# Patient Record
Sex: Male | Born: 1970 | Race: Black or African American | Hispanic: No | Marital: Married | Smoking: Never smoker
Health system: Southern US, Community
[De-identification: ages and names within clinical notes are randomized; demographics above are authoritative.]

## PROBLEM LIST (undated history)

## (undated) HISTORY — PX: HERNIA REPAIR: SHX51

---

## 1999-10-22 HISTORY — PX: LEG SURGERY: SHX1003

## 1999-10-22 HISTORY — PX: BACK SURGERY: SHX140

## 2000-05-08 ENCOUNTER — Inpatient Hospital Stay (HOSPITAL_COMMUNITY): Admission: EM | Admit: 2000-05-08 | Discharge: 2000-05-14 | Payer: Self-pay

## 2000-05-08 ENCOUNTER — Encounter: Payer: Self-pay | Admitting: General Surgery

## 2000-05-08 ENCOUNTER — Encounter: Payer: Self-pay | Admitting: Emergency Medicine

## 2000-06-18 ENCOUNTER — Encounter: Payer: Self-pay | Admitting: Neurosurgery

## 2000-06-18 ENCOUNTER — Ambulatory Visit (HOSPITAL_COMMUNITY): Admission: RE | Admit: 2000-06-18 | Discharge: 2000-06-18 | Payer: Self-pay | Admitting: Neurosurgery

## 2000-07-23 ENCOUNTER — Encounter: Admission: RE | Admit: 2000-07-23 | Discharge: 2000-08-07 | Payer: Self-pay | Admitting: General Surgery

## 2000-09-22 ENCOUNTER — Encounter: Payer: Self-pay | Admitting: Neurosurgery

## 2000-09-22 ENCOUNTER — Ambulatory Visit (HOSPITAL_COMMUNITY): Admission: RE | Admit: 2000-09-22 | Discharge: 2000-09-22 | Payer: Self-pay | Admitting: Neurosurgery

## 2000-10-19 ENCOUNTER — Emergency Department (HOSPITAL_COMMUNITY): Admission: EM | Admit: 2000-10-19 | Discharge: 2000-10-19 | Payer: Self-pay | Admitting: Emergency Medicine

## 2000-11-19 ENCOUNTER — Encounter: Admission: RE | Admit: 2000-11-19 | Discharge: 2001-02-17 | Payer: Self-pay | Admitting: Neurosurgery

## 2011-04-23 ENCOUNTER — Encounter (INDEPENDENT_AMBULATORY_CARE_PROVIDER_SITE_OTHER): Payer: Self-pay | Admitting: Surgery

## 2011-04-30 ENCOUNTER — Encounter (INDEPENDENT_AMBULATORY_CARE_PROVIDER_SITE_OTHER): Payer: 59 | Admitting: Surgery

## 2011-04-30 ENCOUNTER — Ambulatory Visit (INDEPENDENT_AMBULATORY_CARE_PROVIDER_SITE_OTHER): Payer: 59 | Admitting: Surgery

## 2011-04-30 DIAGNOSIS — Z9889 Other specified postprocedural states: Secondary | ICD-10-CM

## 2011-04-30 NOTE — Progress Notes (Signed)
Subjective:     Patient ID: Barry Conway, male   DOB: 29-Mar-1971, 40 y.o.   MRN: [id number]    There were no vitals taken for this visit.    HPI He is here for a postoperative visit status post right inguinal hernia repair with mesh. He is 3 weeks out from surgery. He is doing very well and has no complaints. Review of Systems     Objective:   Physical Exam On exam, his incision is well-healed. There is no evidence of recurrent hernia.    Assessment:     Patient status post right inguinal hernia repair with mesh.    Plan:     I gave him a note to return to work today. He will refrain from heavy lifting for one more week. I will see him back as needed.

## 2013-12-14 ENCOUNTER — Ambulatory Visit: Payer: Self-pay | Admitting: General Practice

## 2013-12-15 ENCOUNTER — Ambulatory Visit (INDEPENDENT_AMBULATORY_CARE_PROVIDER_SITE_OTHER): Payer: 59 | Admitting: General Practice

## 2013-12-15 ENCOUNTER — Encounter (INDEPENDENT_AMBULATORY_CARE_PROVIDER_SITE_OTHER): Payer: Self-pay

## 2013-12-15 ENCOUNTER — Encounter: Payer: Self-pay | Admitting: General Practice

## 2013-12-15 VITALS — BP 142/84 | HR 70 | Temp 97.9°F | Ht 70.0 in | Wt 211.0 lb

## 2013-12-15 DIAGNOSIS — Z89512 Acquired absence of left leg below knee: Secondary | ICD-10-CM | POA: Insufficient documentation

## 2013-12-15 DIAGNOSIS — S88119A Complete traumatic amputation at level between knee and ankle, unspecified lower leg, initial encounter: Secondary | ICD-10-CM

## 2013-12-15 DIAGNOSIS — Z449 Encounter for fitting and adjustment of unspecified external prosthetic device: Secondary | ICD-10-CM
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
## 2013-12-21 ENCOUNTER — Telehealth: Payer: Self-pay | Admitting: General Practice

## 2013-12-22 NOTE — Telephone Encounter (Signed)
Aware, rx sent in. 

## 2013-12-22 NOTE — Telephone Encounter (Signed)
Please notify patient that information refaxed and confirmed receipt.

## 2014-01-31 ENCOUNTER — Telehealth: Payer: Self-pay | Admitting: General Practice

## 2014-01-31 NOTE — Telephone Encounter (Signed)
Patient aware that we have no more available appts for today

## 2015-09-28 ENCOUNTER — Ambulatory Visit (INDEPENDENT_AMBULATORY_CARE_PROVIDER_SITE_OTHER): Payer: 59 | Admitting: Family Medicine

## 2015-09-28 ENCOUNTER — Encounter: Payer: Self-pay | Admitting: Family Medicine

## 2015-09-28 VITALS — BP 166/102 | HR 59 | Temp 98.2°F | Ht 70.0 in | Wt 203.6 lb

## 2015-09-28 DIAGNOSIS — S39012A Strain of muscle, fascia and tendon of lower back, initial encounter: Secondary | ICD-10-CM
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
## 2015-10-09 ENCOUNTER — Ambulatory Visit: Payer: Managed Care, Other (non HMO) | Admitting: Physical Therapy

## 2015-11-02 NOTE — Therapy (Signed)
[facility] [address] [place], [place], [address] Phone: [phone]   Fax:  [phone]  Patient Details  Name: Barry Conway MRN: [id number] Date of Birth: 04/12/1971 Referring Provider:  Dettinger, Elige RadonJoshua A, MD  Encounter Date: 10/09/2015   Patient arrived with no back pain.  Told patient should his pain return he can call the clinic to re-schedule.  Bryann Gentz, [place] MPT 11/02/2015, 4:26 PM  [facility] [address] [place], [place], [address] Phone: [phone]   Fax:  [phone]

## 2020-04-26 ENCOUNTER — Ambulatory Visit (INDEPENDENT_AMBULATORY_CARE_PROVIDER_SITE_OTHER): Payer: Managed Care, Other (non HMO) | Admitting: Family Medicine

## 2020-04-26 ENCOUNTER — Other Ambulatory Visit: Payer: Self-pay

## 2020-04-26 ENCOUNTER — Encounter: Payer: Self-pay | Admitting: Family Medicine

## 2020-04-26 ENCOUNTER — Ambulatory Visit (INDEPENDENT_AMBULATORY_CARE_PROVIDER_SITE_OTHER): Payer: Managed Care, Other (non HMO)

## 2020-04-26 DIAGNOSIS — M25561 Pain in right knee: Secondary | ICD-10-CM | POA: Diagnosis not present

## 2020-04-26 DIAGNOSIS — M2351 Chronic instability of knee, right knee: Secondary | ICD-10-CM
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
## 2020-05-21 ENCOUNTER — Other Ambulatory Visit: Payer: Self-pay

## 2020-05-21 ENCOUNTER — Ambulatory Visit
Admission: RE | Admit: 2020-05-21 | Discharge: 2020-05-21 | Disposition: A | Discharge status: Home / Self Care | Payer: Managed Care, Other (non HMO) | Source: Ambulatory Visit | Attending: Family Medicine | Admitting: Family Medicine

## 2020-05-21 DIAGNOSIS — M2351 Chronic instability of knee, right knee: Secondary | ICD-10-CM

## 2020-05-21 DIAGNOSIS — M25561 Pain in right knee: Secondary | ICD-10-CM

## 2020-05-22 ENCOUNTER — Telehealth: Payer: Self-pay | Admitting: Family Medicine

## 2020-05-22 NOTE — Telephone Encounter (Signed)
MRI shows some arthritis changes behind the kneecap.  No cartilage tears, no ligament tears.  There's not a clear-cut indication for surgery.  For treatment, could consider:  - physical therapy - cortisone injection

## 2020-05-24 NOTE — Telephone Encounter (Signed)
I called and left voice mail to call me back. 

## 2020-05-25 NOTE — Telephone Encounter (Signed)
I called and advised the patient. He is choosing to try the cortisone injection. Scheduled an appointment for 8/11 at 3:40 with Dr. Prince Rome.

## 2020-05-31 ENCOUNTER — Encounter: Payer: Self-pay | Admitting: Family Medicine

## 2020-05-31 ENCOUNTER — Ambulatory Visit (INDEPENDENT_AMBULATORY_CARE_PROVIDER_SITE_OTHER): Payer: Managed Care, Other (non HMO) | Admitting: Family Medicine

## 2020-05-31 ENCOUNTER — Other Ambulatory Visit: Payer: Self-pay

## 2020-05-31 DIAGNOSIS — M25561 Pain in right knee: Secondary | ICD-10-CM

## 2020-05-31 NOTE — Progress Notes (Signed)
   Office Visit Note   Patient: Barry Conway           Date of Birth: 1970-11-24           MRN: [id number] Visit Date: 05/31/2020 Requested by: Dettinger, Elige Radon, MD [address] [place],  [place] [address] PCP: Dettinger, Elige Radon, MD  Subjective: Chief Complaint  Patient presents with  . Right Knee - Pain    HPI:  49yo M with known history of patellar compartment OA, who is presenting today requesting a cortisone injection of his knee. Patient states his pain is very troublesome with prolonged walking, running, or going up and down stairs. He states that his pain has progressed to the point where he feels he is willing to try the steroid as previously discussed.  He is very nervous today about the injection, but is optimistic this may help. He has never had a joint injection before.               ROS:   All other systems were reviewed and are negative.  Objective: Vital Signs: There were no vitals taken for this visit.  Physical Exam:  General:  Alert and oriented, in no acute distress. Pulm:  Breathing unlabored. Psy:  Normal mood, congruent affect. Skin:  No rashes, bruising. Right knee skin intact.   Right knee:  No obvious effusion. 2+ patellar crepitus with knee flexion/extension. Pain with patellar compression. Pain with palpation of lateral joint line.   Imaging: No results found.  Assessment & Plan: 49yo M presenting to clinic with chronic knee pain, known patellar OA, requesting steroid injection. Risks and benefits of steroid injection discussed, and verbal consent obtained. Procedure performed as described below, which patient tolerated very well. No immediate complications.  - Strict return precautions were discussed - If no improvement, RTC for reevaluation and consideration of Viscosupplementation or other treatment modality.  -Patient has no further questions or concerns today.      Procedures: RIGHT KNEE INJECTION:  Risks and benefits discussed, patient  opted to proceed.  Time out performed.  Overlying skin prepped in a sterile fashion using iodine and alcohol.  8cc 1% licocaine was injected into the right knee while leg was extended, using a superiolateral approach using a 25G, 1.5in needle. Needle remained in place and 1cc methylprednisolone (40mg ) was then injected into the joint. Patient tolerated very well.  Aftercare discussed.      PMFS History: Patient Active Problem List   Diagnosis Date Noted  . History of left below knee amputation (HCC) 12/15/2013   History reviewed. No pertinent past medical history.  History reviewed. No pertinent family history.  Past Surgical History:  Procedure Laterality Date  . BACK SURGERY  2001  . HERNIA REPAIR    . LEG SURGERY  2001   Social History   Occupational History  . Not on file  Tobacco Use  . Smoking status: Never Smoker  . Smokeless tobacco: Never Used  Substance and Sexual Activity  . Alcohol use: No  . Drug use: Not on file  . Sexual activity: Not on file

## 2020-05-31 NOTE — Progress Notes (Signed)
I saw and examined the patient with Dr. Marga Hoots and agree with assessment and plan as outlined.    Here with persistent right knee pain.  MRI showed mild arthritis changes in patellofemoral joint.  Will inject with cortisone.  If pain persists, then PT.

## 2020-06-29 ENCOUNTER — Encounter: Payer: Self-pay | Admitting: Family Medicine

## 2020-06-29 ENCOUNTER — Ambulatory Visit (INDEPENDENT_AMBULATORY_CARE_PROVIDER_SITE_OTHER): Payer: Managed Care, Other (non HMO) | Admitting: Family Medicine

## 2020-06-29 ENCOUNTER — Other Ambulatory Visit: Payer: Self-pay

## 2020-06-29 DIAGNOSIS — M1711 Unilateral primary osteoarthritis, right knee: Secondary | ICD-10-CM

## 2020-06-29 MED ORDER — DICLOFENAC SODIUM 1 % EX GEL: 4.0000 g | Freq: Four times a day (QID) | CUTANEOUS | 6 refills | Status: DC | PRN

## 2020-06-29 NOTE — Progress Notes (Signed)
   Office Visit Note   Patient: Barry Conway           Date of Birth: 1971/08/05           MRN: [id number] Visit Date: 06/29/2020 Requested by: Dettinger, Elige Radon, MD [address] [place],  [place] [address] PCP: Dettinger, Elige Radon, MD  Subjective: Chief Complaint  Patient presents with  . Right Knee - Pain, Follow-up    The cortisone injection helped for 2 weeks and then the knee is back to as before.    HPI: He is here for follow-up right knee patellofemoral DJD.  Unfortunately the cortisone injection helped only 2 weeks.  The pain is most intense when loading weight on the knee when it is bent, squatting down, sitting into a chair, going up steps.  It bothers him at night when he has his knee in a certain position, or when he tries to move it, but most the time it does not wake him from sleep.              ROS:   All other systems were reviewed and are negative.  Objective: Vital Signs: There were no vitals taken for this visit.  Physical Exam:  General:  Alert and oriented, in no acute distress. Pulm:  Breathing unlabored. Psy:  Normal mood, congruent affect.  Right knee: Trace effusion, no warmth or erythema.  2+ patellofemoral crepitus, no significant pain with patella compression today.  Imaging: No results found.  Assessment & Plan: 1.  Right knee patellofemoral DJD -Discussed options and elected to try glucosamine and turmeric over-the-counter, Voltaren gel topically.  We will also request approval for gel injections for the right knee.     Procedures: No procedures performed  No notes on file     PMFS History: Patient Active Problem List   Diagnosis Date Noted  . History of left below knee amputation (HCC) 12/15/2013   History reviewed. No pertinent past medical history.  History reviewed. No pertinent family history.  Past Surgical History:  Procedure Laterality Date  . BACK SURGERY  2001  . HERNIA REPAIR    . LEG SURGERY  2001   Social History    Occupational History  . Not on file  Tobacco Use  . Smoking status: Never Smoker  . Smokeless tobacco: Never Used  Substance and Sexual Activity  . Alcohol use: No  . Drug use: Not on file  . Sexual activity: Not on file

## 2020-06-29 NOTE — Patient Instructions (Signed)
     Glucosamine Sulfate:  1,000 mg twice daily  Turmeric:  500 mg twice daily  Voltaren gel:  Can apply up to 4 times daily as needed

## 2020-06-30 ENCOUNTER — Telehealth: Payer: Self-pay | Admitting: Family Medicine

## 2020-06-30 NOTE — Telephone Encounter (Signed)
I called the patient and advised him the medication needed prior approval from his insurance company. It has been approved, so he can obtain this from [facility] today (I left the authorization on [facility] VM, as well).

## 2020-06-30 NOTE — Telephone Encounter (Signed)
Patient called requesting Terri to call pharmacy. Patient states they will not release diclofenac sodium gel prescribed from Dr. Prince Rome until cleared to release by doctor or nurse. [facility] on file and call patient. Patient phone number is [phone].

## 2020-07-04 NOTE — Progress Notes (Signed)
Noted  

## 2020-07-12 ENCOUNTER — Telehealth: Payer: Self-pay

## 2020-07-12 NOTE — Telephone Encounter (Signed)
Submitted VOB, Monovisc, right knee. 

## 2020-07-18 ENCOUNTER — Telehealth: Payer: Self-pay

## 2020-07-18 NOTE — Telephone Encounter (Signed)
Approved, Monovisc, right knee. [place] Once deductible has been met, patient will be responsible for co-insurance. No Co-pay No PA required  Appt. 07/20/2020 with Dr. Junius Roads

## 2020-07-20 ENCOUNTER — Encounter: Payer: Self-pay | Admitting: Family Medicine

## 2020-07-20 ENCOUNTER — Ambulatory Visit (INDEPENDENT_AMBULATORY_CARE_PROVIDER_SITE_OTHER): Payer: Managed Care, Other (non HMO) | Admitting: Family Medicine

## 2020-07-20 ENCOUNTER — Other Ambulatory Visit: Payer: Self-pay

## 2020-07-20 DIAGNOSIS — M1711 Unilateral primary osteoarthritis, right knee: Secondary | ICD-10-CM

## 2020-07-20 NOTE — Progress Notes (Signed)
Subjective: He is here for planned Monovisc injection for right knee osteoarthritis.  Objective: Trace effusion with no warmth or erythema.  Procedure: Right knee Monovisc injection: After sterile prep with Betadine, injected 3 cc 1% lidocaine without epinephrine and Monovisc from lateral midpatellar approach, a flash of clear yellow synovial fluid was obtained prior to injection.

## 2020-08-21 ENCOUNTER — Ambulatory Visit (INDEPENDENT_AMBULATORY_CARE_PROVIDER_SITE_OTHER): Payer: Managed Care, Other (non HMO) | Admitting: Family Medicine

## 2020-08-21 ENCOUNTER — Other Ambulatory Visit: Payer: Self-pay

## 2020-08-21 ENCOUNTER — Encounter: Payer: Self-pay | Admitting: Family Medicine

## 2020-08-21 DIAGNOSIS — M1711 Unilateral primary osteoarthritis, right knee: Secondary | ICD-10-CM

## 2020-08-21 NOTE — Progress Notes (Signed)
Office Visit Note   Patient: Barry Conway           Date of Birth: 06/04/1971           MRN: [id number] Visit Date: 08/21/2020 Requested by: Dettinger, Elige Radon, MD [address] [place],  [place] [address] PCP: Dettinger, Elige Radon, MD  Subjective: Chief Complaint  Patient presents with  . Right Knee - Pain, Follow-up    S/p Monovisc injection 07/20/20 -- no improvement. Pain is in the lateral aspect of the knee. Worsens with going up/down stairs and with getting up/down from seated position.    HPI: 49yo M (s/p L BKA) presenting to clinic to follow up on Chronic R knee pain. Patient states he experienced no significant improvement after Gel shots 1 month ago, and is frustrated by his continued pain. He remains trying to work out, go hunting regularly, but states the pain is making these activities difficult- especially if there are any stairs involved. He wants to know what else he can do to improve his symptoms. States his pain is largely unchanged- primarily behind the knee cap and radiating laterally.               ROS:   All other systems were reviewed and are negative.  Objective: Vital Signs: There were no vitals taken for this visit.  Physical Exam:  General:  Alert and oriented, in no acute distress. Pulm:  Breathing unlabored. Psy:  Normal mood, congruent affect. Skin:  Right knee with no bruising or rashes. No erythema. Overlying skin intact.   RIGHT KNEE EXAM:  Seated Exam:  Patellar crepitus with knee flexion/extesion, Negative J-Sign.   Palpation: Endorses tenderness with palpation of patella and patellar facets (L>M). No tenderness to palpation over medial or lateral joint lines.   Supine exam: No effusion, normal patellar mobility.   Ligamentous Exam:  No pain or laxity with anterior/posterior drawer.  No obvious Sag.  No pain or laxity with varus/valgus stress across the knee.   Meniscus:  McMurray with no pain or deep clicking.    Imaging: None  Today.  Assessment & Plan: 49yo M presenting to clinic to follow up on chronic R knee pain. Previous MRI with patellofemoral compartmental arthritis, and symptoms remain consistent with this diagnosis. Given failure of injection therapy thus far, will give a trial of patellar unloading brace to see if this offers any benefit.  - Will submit brace to insurance, and send for fitting when approved - If no benefit with bracing, could consider surgical referral vs genicular nerve block  - Patient is agreeable with plan, and had no further questions or concerns today.      Procedures: No procedures performed  No notes on file     PMFS History: Patient Active Problem List   Diagnosis Date Noted  . History of left below knee amputation (HCC) 12/15/2013   History reviewed. No pertinent past medical history.  History reviewed. No pertinent family history.  Past Surgical History:  Procedure Laterality Date  . BACK SURGERY  2001  . HERNIA REPAIR    . LEG SURGERY  2001   Social History   Occupational History  . Not on file  Tobacco Use  . Smoking status: Never Smoker  . Smokeless tobacco: Never Used  Substance and Sexual Activity  . Alcohol use: No  . Drug use: Not on file  . Sexual activity: Not on file

## 2020-08-21 NOTE — Progress Notes (Signed)
I saw and examined the patient with Dr. Marga Hoots and agree with assessment and plan as outlined.    Unfortunately, not much relief with gel injection.  Ongoing patellofemoral pain.  Will check on insurance coverage for patellofemoral unloading brace.  If that doesn't help, consider consult for arthroscopic intervention vs. Genicular nerve block.

## 2020-08-22 NOTE — Progress Notes (Signed)
Order, demographics and pertinent office notes/reports emailed to [facility].

## 2021-03-04 IMAGING — MR MR KNEE*R* W/O CM
4 of 7 series · 22 of 40 positions shown · non-contrast
Comparison: Radiographs 04/26/2020

CLINICAL DATA: Anterolateral knee pain and instability since a
twisting injury at work 9 years ago. Worsening pain with weight
bearing. No previous relevant surgery.

EXAM:
MRI OF THE RIGHT KNEE WITHOUT CONTRAST
TECHNIQUE: Multiplanar, multisequence MR imaging of the knee was performed. No
intravenous contrast was administered.

[Series 4: T1 · coronal · 4.0mm · 0.29mm/px · 5 of 29 slices shown]
[im 1/29]
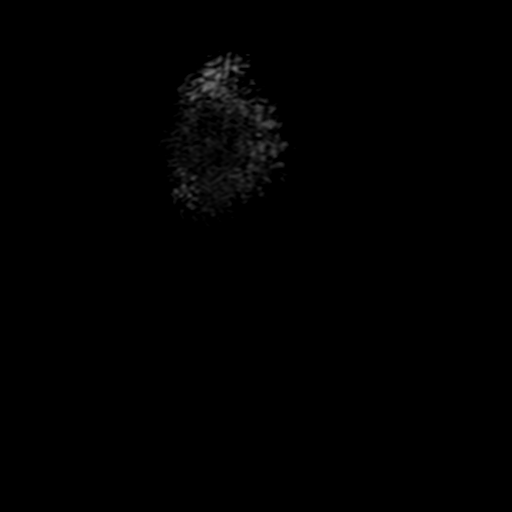
[im 6/29]
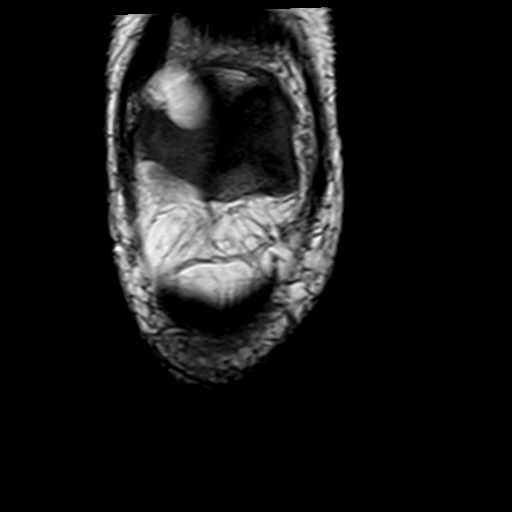
[im 12/29]
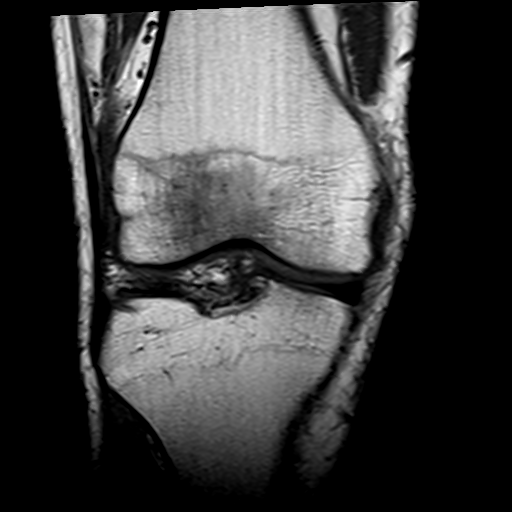
[im 17/29]
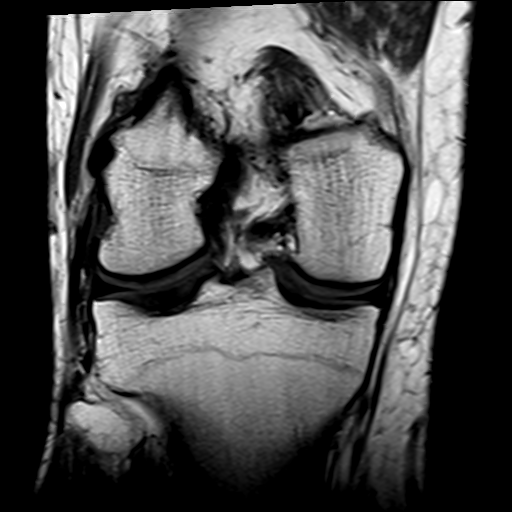
[im 29/29]
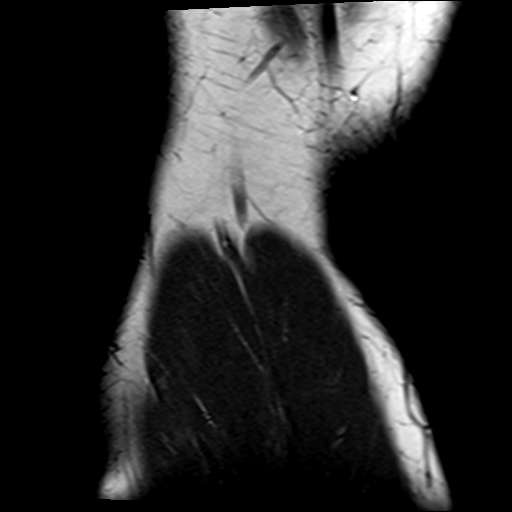

[Series 5: T2 fat-sat · coronal · 4.0mm · 0.59mm/px · 5 of 25 slices shown (1 of 2)]
[im 1/25]
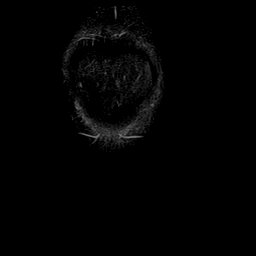
[im 7/25]
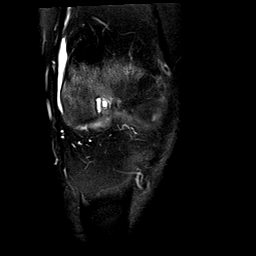
[im 13/25]
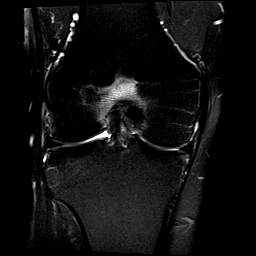
[im 19/25]
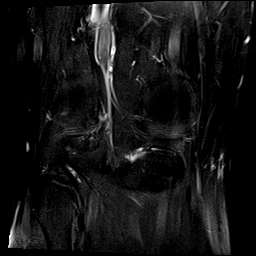
[im 25/25]
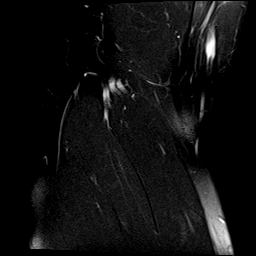

[Series 7: PD fat-sat · sagittal · 3.0mm · 0.31mm/px · 6 of 29 slices shown]
[im 1/29]
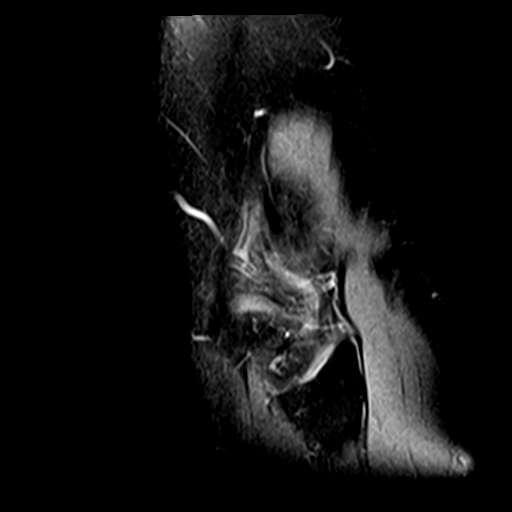
[im 6/29]
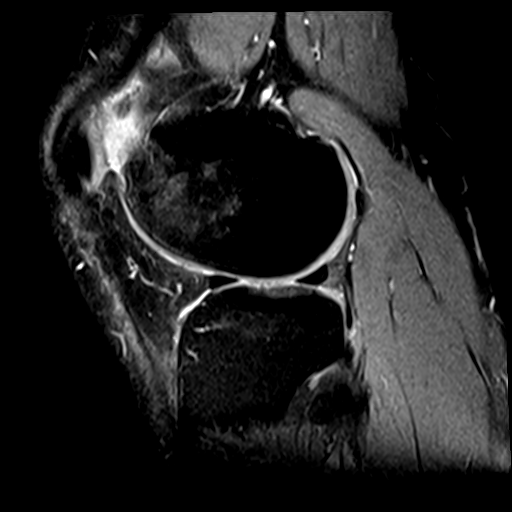
[im 12/29]
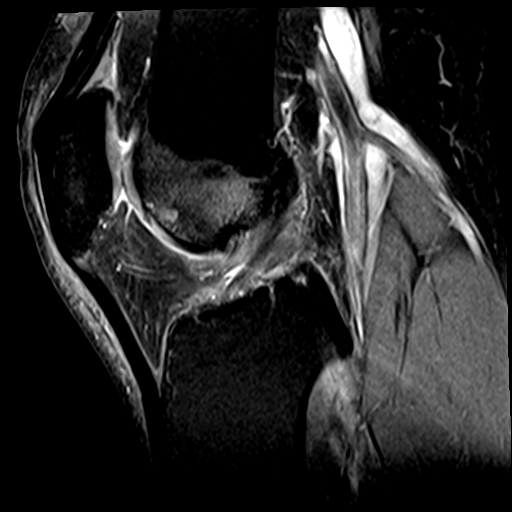
[im 17/29]
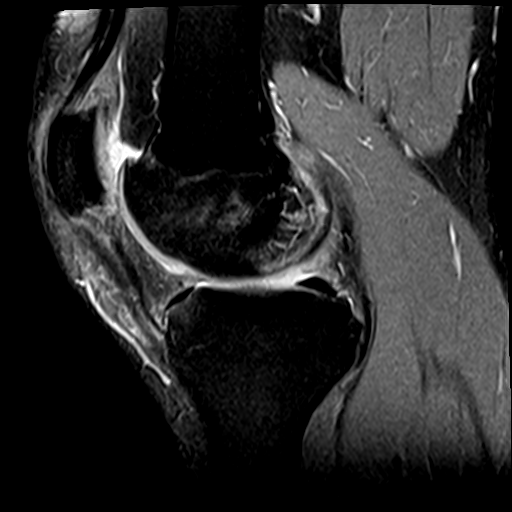
[im 23/29]
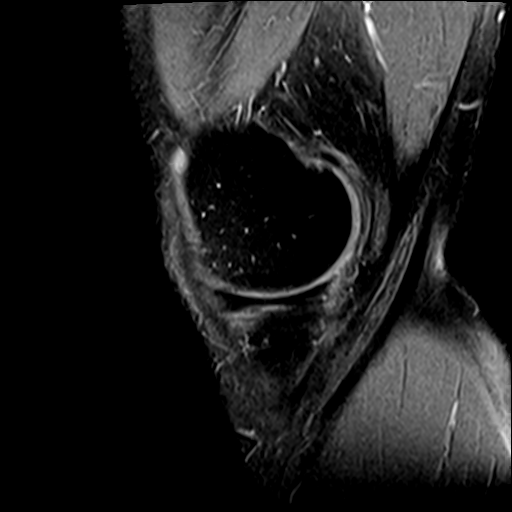
[im 29/29]
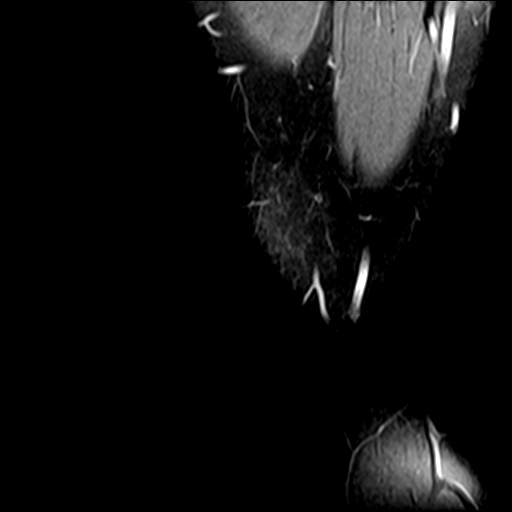

[Series 8: T2 fat-sat · sagittal · 3.0mm · 0.31mm/px · 6 of 28 slices shown (2 of 2)]
[im 1/28]
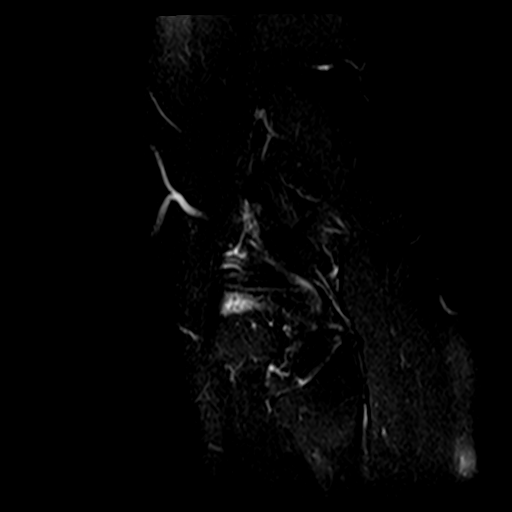
[im 6/28]
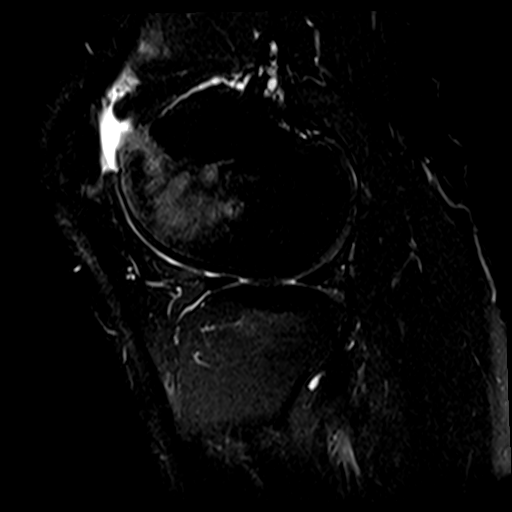
[im 11/28]
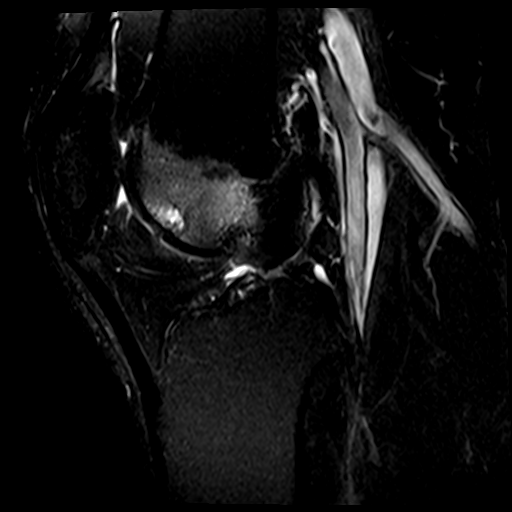
[im 17/28]
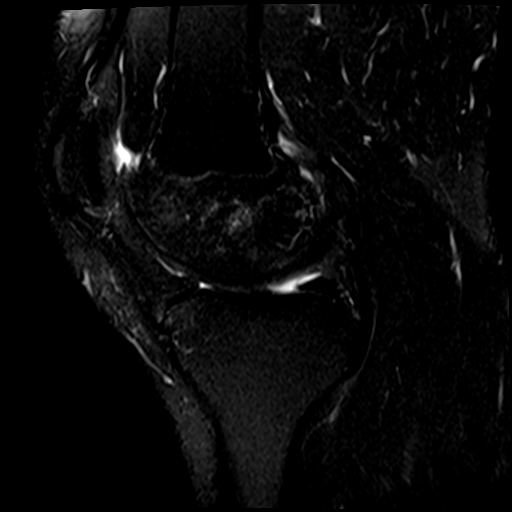
[im 22/28]
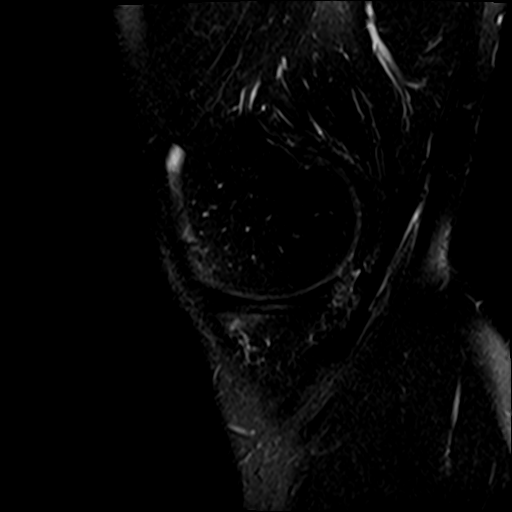
[im 28/28]
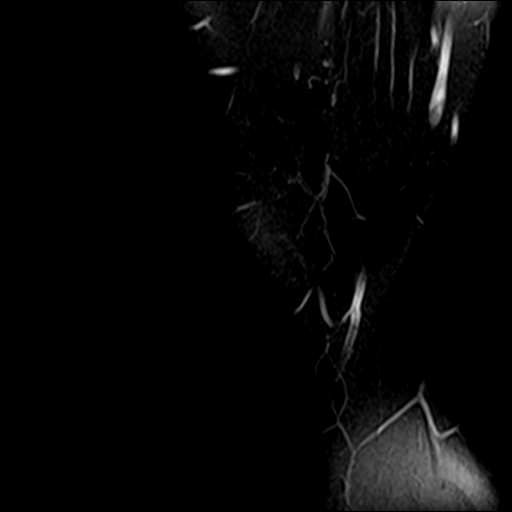

[22 of 40 positions shown; findings below may reference images not displayed]

FINDINGS: MENISCI

Medial meniscus:  Intact with normal morphology.

Lateral meniscus:  Intact with normal morphology.

LIGAMENTS

Cruciates:  Intact.

Collaterals:  Intact.

CARTILAGE

Patellofemoral: Mild chondral thinning, surface irregularity and
subchondral cyst formation involving the medial patellar facet
superiorly. There is more extensive trochlear chondromalacia with
chondral irregularity, a 13 mm subchondral cyst laterally in the
trochlea and prominent marrow edema throughout the anterior aspect
of the distal femur.

Medial: Mild chondral thinning and surface irregularity without
focal defect.

Lateral: Mild chondral thinning and surface irregularity without
focal defect.

MISCELLANEOUS

Joint: Small joint effusion. No evidence of intra-articular loose
body.

Popliteal Fossa:  Unremarkable. No significant Baker's cyst.

Extensor Mechanism:  Intact.

Bones: As above, prominent bone marrow edema antral laterally in the
distal femur surrounding a prominent subchondral cyst, probably
reactive. There is an additional small subchondral cyst or
intraosseous ganglion anteriorly in the lateral tibial plateau
without surrounding marrow edema. No definite acute osseous
findings.

Other: No other significant periarticular soft tissue findings.
IMPRESSION: 1. Moderate patellofemoral degenerative changes with prominent
subchondral cyst and surrounding reactive edema in the lateral
femoral trochlea.
2. Intact menisci, cruciate and collateral ligaments.
3. No definite acute osseous findings.

## 2021-09-12 ENCOUNTER — Ambulatory Visit: Payer: Self-pay

## 2021-09-12 ENCOUNTER — Other Ambulatory Visit: Payer: Self-pay

## 2021-09-25 ENCOUNTER — Ambulatory Visit: Payer: Self-pay | Admitting: Family Medicine

## 2021-09-27 ENCOUNTER — Ambulatory Visit (INDEPENDENT_AMBULATORY_CARE_PROVIDER_SITE_OTHER): Payer: No Typology Code available for payment source | Admitting: Family Medicine

## 2021-09-27 ENCOUNTER — Encounter: Payer: Self-pay | Admitting: Family Medicine

## 2021-09-27 VITALS — BP 131/82 | HR 54 | Temp 98.1°F | Ht 70.0 in | Wt 202.0 lb

## 2021-09-27 DIAGNOSIS — Z6828 Body mass index (BMI) 28.0-28.9, adult: Secondary | ICD-10-CM | POA: Diagnosis not present

## 2021-09-27 DIAGNOSIS — Z89512 Acquired absence of left leg below knee: Secondary | ICD-10-CM | POA: Diagnosis not present

## 2021-09-27 DIAGNOSIS — I10 Essential (primary) hypertension: Secondary | ICD-10-CM

## 2021-09-27 DIAGNOSIS — Z6826 Body mass index (BMI) 26.0-26.9, adult: Secondary | ICD-10-CM | POA: Insufficient documentation

## 2021-09-27 LAB — LIPID PANEL

## 2021-09-27 MED ORDER — LISINOPRIL-HYDROCHLOROTHIAZIDE 10-12.5 MG PO TABS: 1.0000 | ORAL_TABLET | Freq: Every day | ORAL | 1 refills | Status: DC
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
## 2021-09-27 NOTE — Patient Instructions (Signed)

## 2021-09-28 LAB — CMP14+EGFR
ALT: 19 IU/L (ref 0–44)
AST: 25 IU/L (ref 0–40)
Albumin/Globulin Ratio: 2 (ref 1.2–2.2)
Albumin: 4.2 g/dL (ref 3.8–4.9)
Alkaline Phosphatase: 57 IU/L (ref 44–121)
BUN/Creatinine Ratio: 12 (ref 9–20)
BUN: 14 mg/dL (ref 6–24)
Bilirubin Total: 0.3 mg/dL (ref 0.0–1.2)
CO2: 26 mmol/L (ref 20–29)
Calcium: 9.5 mg/dL (ref 8.7–10.2)
Chloride: 102 mmol/L (ref 96–106)
Creatinine, Ser: 1.14 mg/dL (ref 0.76–1.27)
Globulin, Total: 2.1 g/dL (ref 1.5–4.5)
Glucose: 96 mg/dL (ref 70–99)
Potassium: 3.7 mmol/L (ref 3.5–5.2)
Sodium: 140 mmol/L (ref 134–144)
Total Protein: 6.3 g/dL (ref 6.0–8.5)
eGFR: 78 mL/min/{1.73_m2} (ref >59)

## 2021-09-28 LAB — LIPID PANEL
Chol/HDL Ratio: 3.3 ratio (ref 0.0–5.0)
Cholesterol, Total: 174 mg/dL (ref 100–199)
HDL: 53 mg/dL (ref >39)
LDL Chol Calc (NIH): 111 mg/dL — ABNORMAL HIGH (ref 0–99)
Triglycerides: 52 mg/dL (ref 0–149)
VLDL Cholesterol Cal: 10 mg/dL (ref 5–40)

## 2021-09-28 LAB — CBC WITH DIFFERENTIAL/PLATELET
Basophils Absolute: 0 10*3/uL (ref 0.0–0.2)
Basos: 1 %
EOS (ABSOLUTE): 0 10*3/uL (ref 0.0–0.4)
Eos: 1 %
Hematocrit: 41.5 % (ref 37.5–51.0)
Hemoglobin: 13.5 g/dL (ref 13.0–17.7)
Immature Grans (Abs): 0 10*3/uL (ref 0.0–0.1)
Immature Granulocytes: 0 %
Lymphocytes Absolute: 1.8 10*3/uL (ref 0.7–3.1)
Lymphs: 53 %
MCH: 27.9 pg (ref 26.6–33.0)
MCHC: 32.5 g/dL (ref 31.5–35.7)
MCV: 86 fL (ref 79–97)
Monocytes Absolute: 0.2 10*3/uL (ref 0.1–0.9)
Monocytes: 7 %
Neutrophils Absolute: 1.3 10*3/uL — ABNORMAL LOW (ref 1.4–7.0)
Neutrophils: 38 %
Platelets: 225 10*3/uL (ref 150–450)
RBC: 4.84 x10E6/uL (ref 4.14–5.80)
RDW: 13 % (ref 11.6–15.4)
WBC: 3.4 10*3/uL (ref 3.4–10.8)

## 2021-09-28 LAB — THYROID PANEL WITH TSH
Free Thyroxine Index: 2.4 (ref 1.2–4.9)
T3 Uptake Ratio: 31 % (ref 24–39)
T4, Total: 7.6 ug/dL (ref 4.5–12.0)
TSH: 0.823 u[IU]/mL (ref 0.450–4.500)

## 2021-12-26 ENCOUNTER — Encounter: Payer: Self-pay | Admitting: Family Medicine

## 2021-12-26 ENCOUNTER — Ambulatory Visit (INDEPENDENT_AMBULATORY_CARE_PROVIDER_SITE_OTHER): Payer: No Typology Code available for payment source | Admitting: Family Medicine

## 2021-12-26 VITALS — BP 116/62 | HR 57 | Temp 98.1°F | Resp 20 | Ht 70.0 in | Wt 197.0 lb

## 2021-12-26 DIAGNOSIS — Z1212 Encounter for screening for malignant neoplasm of rectum: Secondary | ICD-10-CM

## 2021-12-26 DIAGNOSIS — Z1211 Encounter for screening for malignant neoplasm of colon: Secondary | ICD-10-CM

## 2021-12-26 DIAGNOSIS — I10 Essential (primary) hypertension: Secondary | ICD-10-CM

## 2021-12-26 DIAGNOSIS — Z89512 Acquired absence of left leg below knee: Secondary | ICD-10-CM

## 2021-12-26 DIAGNOSIS — Z23 Encounter for immunization: Secondary | ICD-10-CM | POA: Diagnosis not present

## 2021-12-26 DIAGNOSIS — Z0001 Encounter for general adult medical examination with abnormal findings: Secondary | ICD-10-CM | POA: Diagnosis not present

## 2021-12-26 DIAGNOSIS — Z0000 Encounter for general adult medical examination without abnormal findings: Secondary | ICD-10-CM

## 2021-12-26 LAB — LIPID PANEL
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
## 2021-12-26 NOTE — Addendum Note (Signed)
Addended by: Sonny Masters on: 12/26/2021 08:55 AM ? ? Modules accepted: Level of Service ? ?

## 2021-12-27 ENCOUNTER — Other Ambulatory Visit: Payer: Self-pay | Admitting: *Deleted

## 2021-12-27 DIAGNOSIS — R7989 Other specified abnormal findings of blood chemistry: Secondary | ICD-10-CM

## 2021-12-27 LAB — CBC WITH DIFFERENTIAL/PLATELET
Basophils Absolute: 0 10*3/uL (ref 0.0–0.2)
Basos: 1 %
EOS (ABSOLUTE): 0.1 10*3/uL (ref 0.0–0.4)
Eos: 1 %
Hematocrit: 42.6 % (ref 37.5–51.0)
Hemoglobin: 14.1 g/dL (ref 13.0–17.7)
Immature Grans (Abs): 0 10*3/uL (ref 0.0–0.1)
Immature Granulocytes: 0 %
Lymphocytes Absolute: 2.3 10*3/uL (ref 0.7–3.1)
Lymphs: 56 %
MCH: 28.2 pg (ref 26.6–33.0)
MCHC: 33.1 g/dL (ref 31.5–35.7)
MCV: 85 fL (ref 79–97)
Monocytes Absolute: 0.3 10*3/uL (ref 0.1–0.9)
Monocytes: 8 %
Neutrophils Absolute: 1.4 10*3/uL (ref 1.4–7.0)
Neutrophils: 34 %
Platelets: 218 10*3/uL (ref 150–450)
RBC: 5 x10E6/uL (ref 4.14–5.80)
RDW: 12.7 % (ref 11.6–15.4)
WBC: 4 10*3/uL (ref 3.4–10.8)

## 2021-12-27 LAB — CMP14+EGFR
ALT: 27 IU/L (ref 0–44)
AST: 28 IU/L (ref 0–40)
Albumin/Globulin Ratio: 2.1 (ref 1.2–2.2)
Albumin: 4.5 g/dL (ref 3.8–4.9)
Alkaline Phosphatase: 65 IU/L (ref 44–121)
BUN/Creatinine Ratio: 13 (ref 9–20)
BUN: 16 mg/dL (ref 6–24)
Bilirubin Total: 0.3 mg/dL (ref 0.0–1.2)
CO2: 27 mmol/L (ref 20–29)
Calcium: 9.4 mg/dL (ref 8.7–10.2)
Chloride: 101 mmol/L (ref 96–106)
Creatinine, Ser: 1.28 mg/dL — ABNORMAL HIGH (ref 0.76–1.27)
Globulin, Total: 2.1 g/dL (ref 1.5–4.5)
Glucose: 87 mg/dL (ref 70–99)
Potassium: 4.8 mmol/L (ref 3.5–5.2)
Sodium: 139 mmol/L (ref 134–144)
Total Protein: 6.6 g/dL (ref 6.0–8.5)
eGFR: 68 mL/min/{1.73_m2} (ref >59)

## 2021-12-27 LAB — LIPID PANEL
Chol/HDL Ratio: 3.1 ratio (ref 0.0–5.0)
Cholesterol, Total: 176 mg/dL (ref 100–199)
HDL: 57 mg/dL (ref >39)
LDL Chol Calc (NIH): 109 mg/dL — ABNORMAL HIGH (ref 0–99)
Triglycerides: 48 mg/dL (ref 0–149)
VLDL Cholesterol Cal: 10 mg/dL (ref 5–40)

## 2021-12-27 LAB — THYROID PANEL WITH TSH
Free Thyroxine Index: 2.5 (ref 1.2–4.9)
T3 Uptake Ratio: 30 % (ref 24–39)
T4, Total: 8.4 ug/dL (ref 4.5–12.0)
TSH: 0.834 u[IU]/mL (ref 0.450–4.500)

## 2022-02-20 LAB — COLOGUARD

## 2022-02-27 ENCOUNTER — Other Ambulatory Visit: Payer: Self-pay | Admitting: Family Medicine

## 2022-02-27 DIAGNOSIS — Z1211 Encounter for screening for malignant neoplasm of colon: Secondary | ICD-10-CM

## 2022-03-17 ENCOUNTER — Other Ambulatory Visit: Payer: Self-pay | Admitting: Family Medicine

## 2022-03-17 DIAGNOSIS — I10 Essential (primary) hypertension: Secondary | ICD-10-CM

## 2022-04-11 ENCOUNTER — Other Ambulatory Visit: Payer: Self-pay | Admitting: Family Medicine

## 2022-04-11 DIAGNOSIS — I10 Essential (primary) hypertension: Secondary | ICD-10-CM

## 2022-05-16 ENCOUNTER — Encounter: Payer: Self-pay | Admitting: Family Medicine

## 2022-07-07 ENCOUNTER — Other Ambulatory Visit: Payer: Self-pay | Admitting: Family Medicine

## 2022-07-07 DIAGNOSIS — I10 Essential (primary) hypertension: Secondary | ICD-10-CM

## 2022-07-30 ENCOUNTER — Encounter: Payer: Self-pay | Admitting: Family Medicine

## 2022-07-30 ENCOUNTER — Other Ambulatory Visit: Payer: Self-pay | Admitting: Family Medicine

## 2022-07-30 DIAGNOSIS — I10 Essential (primary) hypertension: Secondary | ICD-10-CM

## 2022-07-30 NOTE — Telephone Encounter (Signed)
Michelle NTBS 30 days given 07/08/22

## 2022-07-30 NOTE — Telephone Encounter (Signed)
Letter sent.

## 2022-08-03 ENCOUNTER — Other Ambulatory Visit: Payer: Self-pay | Admitting: Family Medicine

## 2022-08-03 DIAGNOSIS — I10 Essential (primary) hypertension: Secondary | ICD-10-CM

## 2022-10-28 ENCOUNTER — Telehealth: Payer: Self-pay | Admitting: Family Medicine

## 2022-10-28 DIAGNOSIS — I10 Essential (primary) hypertension: Secondary | ICD-10-CM

## 2022-10-28 MED ORDER — LISINOPRIL-HYDROCHLOROTHIAZIDE 10-12.5 MG PO TABS: 1.0000 | ORAL_TABLET | Freq: Every day | ORAL | 0 refills | Status: DC

## 2022-10-28 NOTE — Telephone Encounter (Signed)
30 day supply sent in and pt is aware he must come to his follow up appt or no further refills will be given.

## 2022-10-28 NOTE — Telephone Encounter (Signed)
  Prescription Request  10/28/2022  Is this a "Controlled Substance" medicine? no  Have you seen your PCP in the last 2 weeks? NO pt has appt with [facility] on 11/05/22 he is out of this medication   If YES, route message to pool  -  If NO, patient needs to be scheduled for appointment.  What is the name of the medication or equipment? lisinopril-hydrochlorothiazide (ZESTORETIC) 10-12.5 MG tablet   Have you contacted your pharmacy to request a refill? yes   Which pharmacy would you like this sent to? [facility]    Patient notified that their request is being sent to the clinical staff for review and that they should receive a response within 2 business days.

## 2022-11-05 ENCOUNTER — Encounter: Payer: Self-pay | Admitting: Family Medicine

## 2022-11-05 ENCOUNTER — Ambulatory Visit (INDEPENDENT_AMBULATORY_CARE_PROVIDER_SITE_OTHER): Payer: No Typology Code available for payment source | Admitting: Family Medicine

## 2022-11-05 VITALS — BP 129/79 | HR 45 | Temp 97.3°F | Ht 70.0 in | Wt 194.6 lb

## 2022-11-05 DIAGNOSIS — I10 Essential (primary) hypertension: Secondary | ICD-10-CM | POA: Diagnosis not present

## 2022-11-05 DIAGNOSIS — M25561 Pain in right knee: Secondary | ICD-10-CM | POA: Diagnosis not present

## 2022-11-05 DIAGNOSIS — Z89512 Acquired absence of left leg below knee: Secondary | ICD-10-CM

## 2022-11-05 DIAGNOSIS — Z6828 Body mass index (BMI) 28.0-28.9, adult: Secondary | ICD-10-CM

## 2022-11-05 DIAGNOSIS — G8929 Other chronic pain: Secondary | ICD-10-CM

## 2022-11-05 DIAGNOSIS — Z1212 Encounter for screening for malignant neoplasm of rectum: Secondary | ICD-10-CM

## 2022-11-05 DIAGNOSIS — Z1211 Encounter for screening for malignant neoplasm of colon: Secondary | ICD-10-CM

## 2022-11-05 MED ORDER — LISINOPRIL-HYDROCHLOROTHIAZIDE 10-12.5 MG PO TABS: 1.0000 | ORAL_TABLET | Freq: Every day | ORAL | 3 refills | Status: DC

## 2022-11-05 NOTE — Progress Notes (Signed)
Subjective:  Patient ID: Barry Conway, male    DOB: [birth year], 52 y.o.   MRN: [id number]  Patient Care Team: Sonny Masters, FNP as PCP - General (Family Medicine)   Chief Complaint:  Hypertension   HPI: Barry Conway is a 52 y.o. male presenting on 11/05/2022 for Hypertension   Hypertension This is a recurrent problem. The problem is controlled. Pertinent negatives include no anxiety, blurred vision, chest pain, headaches, malaise/fatigue, neck pain, orthopnea, palpitations, peripheral edema, PND, shortness of breath or sweats. Risk factors for coronary artery disease include male gender. Past treatments include ACE inhibitors and diuretics. The current treatment provides significant improvement. There are no compliance problems.   Knee Pain  Incident onset: several months ago. There was no injury mechanism. The pain is present in the right knee. The quality of the pain is described as burning and shooting. The pain is moderate. The pain has been Worsening since onset. Pertinent negatives include no inability to bear weight, loss of motion, loss of sensation, muscle weakness, numbness or tingling. He reports no foreign bodies present. The symptoms are aggravated by movement, weight bearing and palpation. He has tried acetaminophen for the symptoms. The treatment provided mild relief.     Relevant past medical, surgical, family, and social history reviewed and updated as indicated.  Allergies and medications reviewed and updated. Data reviewed: Chart in Epic.   History reviewed. No pertinent past medical history.  Past Surgical History:  Procedure Laterality Date   BACK SURGERY  2001   HERNIA REPAIR     LEG SURGERY  2001    Social History   Socioeconomic History   Marital status: Married    Spouse name: Not on file   Number of children: Not on file   Years of education: Not on file   Highest education level: Not on file  Occupational History   Not on file  Tobacco Use    Smoking status: Never   Smokeless tobacco: Never  Vaping Use   Vaping Use: Never used  Substance and Sexual Activity   Alcohol use: No   Drug use: Never   Sexual activity: Not on file  Other Topics Concern   Not on file  Social History Narrative   Not on file   Social Determinants of Health   Financial Resource Strain: Not on file  Food Insecurity: Not on file  Transportation Needs: Not on file  Physical Activity: Not on file  Stress: Not on file  Social Connections: Not on file  Intimate Partner Violence: Not on file    Outpatient Encounter Medications as of 11/05/2022  Medication Sig   [DISCONTINUED] lisinopril-hydrochlorothiazide (ZESTORETIC) 10-12.5 MG tablet Take 1 tablet by mouth daily. (NEEDS TO BE SEEN BEFORE NEXT REFILL)   lisinopril-hydrochlorothiazide (ZESTORETIC) 10-12.5 MG tablet Take 1 tablet by mouth daily.   No facility-administered encounter medications on file as of 11/05/2022.    No Known Allergies  Review of Systems  Constitutional:  Negative for activity change, appetite change, chills, fatigue, fever and malaise/fatigue.  HENT: Negative.    Eyes: Negative.  Negative for blurred vision.  Respiratory:  Negative for cough, chest tightness and shortness of breath.   Cardiovascular:  Negative for chest pain, palpitations, orthopnea, leg swelling and PND.  Gastrointestinal:  Negative for abdominal pain, blood in stool, constipation, diarrhea, nausea and vomiting.  Endocrine: Negative.   Genitourinary:  Negative for decreased urine volume, difficulty urinating, dysuria, frequency and urgency.  Musculoskeletal:  Positive for arthralgias  and joint swelling. Negative for myalgias and neck pain.  Skin: Negative.   Allergic/Immunologic: Negative.   Neurological:  Negative for dizziness, tingling, tremors, seizures, syncope, facial asymmetry, speech difficulty, weakness, light-headedness, numbness and headaches.  Hematological: Negative.    Psychiatric/Behavioral:  Negative for confusion, hallucinations, sleep disturbance and suicidal ideas.   All other systems reviewed and are negative.       Objective:  BP 129/79   Pulse (!) 45   Temp (!) 97.3 F (36.3 C) (Temporal)   Ht 5\' 10"  (1.778 m)   Wt 194 lb 9.6 oz (88.3 kg)   SpO2 100%   BMI 27.92 kg/m    Wt Readings from Last 3 Encounters:  11/05/22 194 lb 9.6 oz (88.3 kg)  12/26/21 197 lb (89.4 kg)  09/27/21 202 lb (91.6 kg)    Physical Exam Vitals and nursing note reviewed.  Constitutional:      General: He is not in acute distress.    Appearance: Normal appearance. He is well-developed and well-groomed. He is not ill-appearing, toxic-appearing or diaphoretic.  HENT:     Head: Normocephalic and atraumatic.     Jaw: There is normal jaw occlusion.     Right Ear: Hearing normal.     Left Ear: Hearing normal.     Nose: Nose normal.     Mouth/Throat:     Lips: Pink.     Mouth: Mucous membranes are moist.     Pharynx: Oropharynx is clear. Uvula midline.  Eyes:     General: Lids are normal.     Extraocular Movements: Extraocular movements intact.     Conjunctiva/sclera: Conjunctivae normal.     Pupils: Pupils are equal, round, and reactive to light.  Neck:     Thyroid: No thyroid mass, thyromegaly or thyroid tenderness.     Vascular: No carotid bruit or JVD.     Trachea: Trachea and phonation normal.  Cardiovascular:     Rate and Rhythm: Normal rate and regular rhythm.     Chest Wall: PMI is not displaced.     Pulses: Normal pulses.     Heart sounds: Normal heart sounds. No murmur heard.    No friction rub. No gallop.  Pulmonary:     Effort: Pulmonary effort is normal. No respiratory distress.     Breath sounds: Normal breath sounds. No wheezing.  Abdominal:     General: Bowel sounds are normal. There is no distension or abdominal bruit.     Palpations: Abdomen is soft. There is no hepatomegaly or splenomegaly.     Tenderness: There is no abdominal  tenderness. There is no right CVA tenderness or left CVA tenderness.     Hernia: No hernia is present.  Musculoskeletal:     Cervical back: Normal range of motion and neck supple.     Right upper leg: Normal.     Right knee: Tenderness present over the lateral joint line.     Right lower leg: Normal.     Comments: L BKA  Lymphadenopathy:     Cervical: No cervical adenopathy.  Skin:    General: Skin is warm and dry.     Capillary Refill: Capillary refill takes less than 2 seconds.     Coloration: Skin is not cyanotic, jaundiced or pale.     Findings: No rash.  Neurological:     General: No focal deficit present.     Mental Status: He is alert and oriented to person, place, and time.  Sensory: Sensation is intact.     Motor: Motor function is intact.     Coordination: Coordination is intact.     Gait: Gait is intact.     Deep Tendon Reflexes: Reflexes are normal and symmetric.  Psychiatric:        Attention and Perception: Attention and perception normal.        Mood and Affect: Mood and affect normal.        Speech: Speech normal.        Behavior: Behavior normal. Behavior is cooperative.        Thought Content: Thought content normal.        Cognition and Memory: Cognition and memory normal.        Judgment: Judgment normal.     Results for orders placed or performed in visit on 12/26/21  CMP14+EGFR  Result Value Ref Range   Glucose 87 70 - 99 mg/dL   BUN 16 6 - 24 mg/dL   Creatinine, Ser 1.28 (H) 0.76 - 1.27 mg/dL   eGFR 68 >59 mL/min/1.73   BUN/Creatinine Ratio 13 9 - 20   Sodium 139 134 - 144 mmol/L   Potassium 4.8 3.5 - 5.2 mmol/L   Chloride 101 96 - 106 mmol/L   CO2 27 20 - 29 mmol/L   Calcium 9.4 8.7 - 10.2 mg/dL   Total Protein 6.6 6.0 - 8.5 g/dL   Albumin 4.5 3.8 - 4.9 g/dL   Globulin, Total 2.1 1.5 - 4.5 g/dL   Albumin/Globulin Ratio 2.1 1.2 - 2.2   Bilirubin Total 0.3 0.0 - 1.2 mg/dL   Alkaline Phosphatase 65 44 - 121 IU/L   AST 28 0 - 40 IU/L   ALT  27 0 - 44 IU/L  CBC with Differential  Result Value Ref Range   WBC 4.0 3.4 - 10.8 x10E3/uL   RBC 5.00 4.14 - 5.80 x10E6/uL   Hemoglobin 14.1 13.0 - 17.7 g/dL   Hematocrit 42.6 37.5 - 51.0 %   MCV 85 79 - 97 fL   MCH 28.2 26.6 - 33.0 pg   MCHC 33.1 31.5 - 35.7 g/dL   RDW 12.7 11.6 - 15.4 %   Platelets 218 150 - 450 x10E3/uL   Neutrophils 34 Not Estab. %   Lymphs 56 Not Estab. %   Monocytes 8 Not Estab. %   Eos 1 Not Estab. %   Basos 1 Not Estab. %   Neutrophils Absolute 1.4 1.4 - 7.0 x10E3/uL   Lymphocytes Absolute 2.3 0.7 - 3.1 x10E3/uL   Monocytes Absolute 0.3 0.1 - 0.9 x10E3/uL   EOS (ABSOLUTE) 0.1 0.0 - 0.4 x10E3/uL   Basophils Absolute 0.0 0.0 - 0.2 x10E3/uL   Immature Granulocytes 0 Not Estab. %   Immature Grans (Abs) 0.0 0.0 - 0.1 x10E3/uL  Thyroid Panel With TSH  Result Value Ref Range   TSH 0.834 0.450 - 4.500 uIU/mL   T4, Total 8.4 4.5 - 12.0 ug/dL   T3 Uptake Ratio 30 24 - 39 %   Free Thyroxine Index 2.5 1.2 - 4.9  Lipid Panel  Result Value Ref Range   Cholesterol, Total 176 100 - 199 mg/dL   Triglycerides 48 0 - 149 mg/dL   HDL 57 >39 mg/dL   VLDL Cholesterol Cal 10 5 - 40 mg/dL   LDL Chol Calc (NIH) 109 (H) 0 - 99 mg/dL   Chol/HDL Ratio 3.1 0.0 - 5.0 ratio  Cologuard  Result Value Ref Range   COLOGUARD Sample Could Not  Be Processed 9 N/A       Pertinent labs & imaging results that were available during my care of the patient were reviewed by me and considered in my medical decision making.  Assessment & Plan:  Ozie was seen today for hypertension.  Diagnoses and all orders for this visit:  Primary hypertension BP well controlled. Changes were not made in regimen today. Goal BP is 130/80. Pt aware to report any persistent high or low readings. DASH diet and exercise encouraged. Exercise at least 150 minutes per week and increase as tolerated. Goal BMI > 25. Stress management encouraged. Avoid nicotine and tobacco product use. Avoid excessive alcohol  and NSAID's. Avoid more than 2000 mg of sodium daily. Medications as prescribed. Follow up as scheduled.  -     lisinopril-hydrochlorothiazide (ZESTORETIC) 10-12.5 MG tablet; Take 1 tablet by mouth daily. -     CBC with Differential/Platelet -     CMP14+EGFR -     Lipid panel -     Thyroid Panel With TSH  BMI 28.0-28.9,adult Diet and exercise encouraged. Labs pending.  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  -     Ambulatory referral to Orthopedic Surgery  Screening for colorectal cancer Cologuard not completed as insurance would not cover it, will refer for colonoscopy.  -     Ambulatory referral to Gastroenterology     Continue all other maintenance medications.  Follow up plan: Return in about 6 months (around 05/06/2023) for HTN, BMP.   Continue healthy lifestyle choices, including diet (rich in fruits, vegetables, and lean proteins, and low in salt and simple carbohydrates) and exercise (at least 30 minutes of moderate physical activity daily).  Educational handout given for DASH diet, HTN  The above assessment and management plan was discussed with the patient. The patient verbalized understanding of and has agreed to the management plan. Patient is aware to call the clinic if they develop any new symptoms or if symptoms persist or worsen. Patient is aware when to return to the clinic for a follow-up visit. Patient educated on when it is appropriate to go to the emergency department.   Kari Baars, FNP-C [facility] [phone]

## 2022-11-05 NOTE — Patient Instructions (Signed)

## 2022-11-06 ENCOUNTER — Encounter: Payer: Self-pay | Admitting: *Deleted

## 2022-11-06 ENCOUNTER — Encounter (INDEPENDENT_AMBULATORY_CARE_PROVIDER_SITE_OTHER): Payer: Self-pay | Admitting: *Deleted

## 2022-11-06 LAB — CMP14+EGFR
ALT: 25 IU/L (ref 0–44)
AST: 31 IU/L (ref 0–40)
Albumin/Globulin Ratio: 2.4 — ABNORMAL HIGH (ref 1.2–2.2)
Albumin: 4.4 g/dL (ref 3.8–4.9)
Alkaline Phosphatase: 61 IU/L (ref 44–121)
BUN/Creatinine Ratio: 12 (ref 9–20)
BUN: 15 mg/dL (ref 6–24)
Bilirubin Total: 0.4 mg/dL (ref 0.0–1.2)
CO2: 25 mmol/L (ref 20–29)
Calcium: 9.3 mg/dL (ref 8.7–10.2)
Chloride: 101 mmol/L (ref 96–106)
Creatinine, Ser: 1.27 mg/dL (ref 0.76–1.27)
Globulin, Total: 1.8 g/dL (ref 1.5–4.5)
Glucose: 96 mg/dL (ref 70–99)
Potassium: 4.1 mmol/L (ref 3.5–5.2)
Sodium: 139 mmol/L (ref 134–144)
Total Protein: 6.2 g/dL (ref 6.0–8.5)
eGFR: 68 mL/min/{1.73_m2} (ref >59)

## 2022-11-06 LAB — CBC WITH DIFFERENTIAL/PLATELET
Basophils Absolute: 0 10*3/uL (ref 0.0–0.2)
Basos: 0 %
EOS (ABSOLUTE): 0 10*3/uL (ref 0.0–0.4)
Eos: 1 %
Hematocrit: 41 % (ref 37.5–51.0)
Hemoglobin: 13.6 g/dL (ref 13.0–17.7)
Immature Grans (Abs): 0 10*3/uL (ref 0.0–0.1)
Immature Granulocytes: 0 %
Lymphocytes Absolute: 1.9 10*3/uL (ref 0.7–3.1)
Lymphs: 57 %
MCH: 29.1 pg (ref 26.6–33.0)
MCHC: 33.2 g/dL (ref 31.5–35.7)
MCV: 88 fL (ref 79–97)
Monocytes Absolute: 0.3 10*3/uL (ref 0.1–0.9)
Monocytes: 9 %
Neutrophils Absolute: 1.1 10*3/uL — ABNORMAL LOW (ref 1.4–7.0)
Neutrophils: 33 %
Platelets: 211 10*3/uL (ref 150–450)
RBC: 4.68 x10E6/uL (ref 4.14–5.80)
RDW: 12.8 % (ref 11.6–15.4)
WBC: 3.4 10*3/uL (ref 3.4–10.8)

## 2022-11-06 LAB — LIPID PANEL
Chol/HDL Ratio: 3 ratio (ref 0.0–5.0)
Cholesterol, Total: 168 mg/dL (ref 100–199)
HDL: 56 mg/dL (ref >39)
LDL Chol Calc (NIH): 101 mg/dL — ABNORMAL HIGH (ref 0–99)
Triglycerides: 56 mg/dL (ref 0–149)
VLDL Cholesterol Cal: 11 mg/dL (ref 5–40)

## 2022-11-06 LAB — THYROID PANEL WITH TSH
Free Thyroxine Index: 1.8 (ref 1.2–4.9)
T3 Uptake Ratio: 27 % (ref 24–39)
T4, Total: 6.8 ug/dL (ref 4.5–12.0)
TSH: 1.65 u[IU]/mL (ref 0.450–4.500)

## 2023-04-22 ENCOUNTER — Encounter: Payer: Self-pay | Admitting: *Deleted

## 2023-05-06 ENCOUNTER — Ambulatory Visit (INDEPENDENT_AMBULATORY_CARE_PROVIDER_SITE_OTHER): Payer: No Typology Code available for payment source | Admitting: Family Medicine

## 2023-05-06 ENCOUNTER — Encounter: Payer: Self-pay | Admitting: Family Medicine

## 2023-05-06 VITALS — BP 123/80 | HR 57 | Temp 97.3°F | Resp 20 | Ht 70.0 in | Wt 194.1 lb

## 2023-05-06 DIAGNOSIS — I10 Essential (primary) hypertension: Secondary | ICD-10-CM

## 2023-05-06 DIAGNOSIS — Z1212 Encounter for screening for malignant neoplasm of rectum: Secondary | ICD-10-CM

## 2023-05-06 DIAGNOSIS — Z1211 Encounter for screening for malignant neoplasm of colon: Secondary | ICD-10-CM | POA: Diagnosis not present

## 2023-05-06 LAB — BASIC METABOLIC PANEL
BUN/Creatinine Ratio: 17 (ref 9–20)
BUN: 21 mg/dL (ref 6–24)
CO2: 25 mmol/L (ref 20–29)
Calcium: 9.7 mg/dL (ref 8.7–10.2)
Chloride: 100 mmol/L (ref 96–106)
Creatinine, Ser: 1.21 mg/dL (ref 0.76–1.27)
Glucose: 91 mg/dL (ref 70–99)
Potassium: 4.3 mmol/L (ref 3.5–5.2)
Sodium: 136 mmol/L (ref 134–144)
eGFR: 72 mL/min/{1.73_m2} (ref >59)

## 2023-05-06 NOTE — Progress Notes (Signed)
Subjective:  Patient ID: Barry Conway, male    DOB: September 10, 1971, 52 y.o.   MRN: [id number]  Patient Care Team: Sonny Masters, FNP as PCP - General (Family Medicine)   Chief Complaint:  Medical Management of Chronic Issues   HPI: Barry Conway is a 52 y.o. male presenting on 05/06/2023 for Medical Management of Chronic Issues Complaint with meds - Yes Current Medications - zestoretic Checking BP at home no Exercising Regularly - Yes Watching Salt intake - Yes Pertinent ROS:  Headache - no Fatigue - no Visual Disturbances - No Chest pain - No Dyspnea - No Palpitations - No LE edema - No They report good compliance with medications and can restate their regimen by memory. No medication side effects.  BP Readings from Last 3 Encounters:  05/06/23 123/80  11/05/22 129/79  12/26/21 116/62      Relevant past medical, surgical, family, and social history reviewed and updated as indicated.  Allergies and medications reviewed and updated. Data reviewed: Chart in Epic.   History reviewed. No pertinent past medical history.  Past Surgical History:  Procedure Laterality Date   BACK SURGERY  2001   HERNIA REPAIR     LEG SURGERY  2001    Social History   Socioeconomic History   Marital status: Married    Spouse name: Not on file   Number of children: Not on file   Years of education: Not on file   Highest education level: Not on file  Occupational History   Not on file  Tobacco Use   Smoking status: Never   Smokeless tobacco: Never  Vaping Use   Vaping status: Never Used  Substance and Sexual Activity   Alcohol use: No   Drug use: Never   Sexual activity: Not on file  Other Topics Concern   Not on file  Social History Narrative   Not on file   Social Determinants of Health   Financial Resource Strain: Not on file  Food Insecurity: Not on file  Transportation Needs: Not on file  Physical Activity: Not on file  Stress: Not on file  Social Connections:  Not on file  Intimate Partner Violence: Not on file    Outpatient Encounter Medications as of 05/06/2023  Medication Sig   lisinopril-hydrochlorothiazide (ZESTORETIC) 10-12.5 MG tablet Take 1 tablet by mouth daily.   No facility-administered encounter medications on file as of 05/06/2023.    No Known Allergies  Review of Systems  Constitutional:  Negative for activity change, appetite change, chills, diaphoresis, fatigue, fever and unexpected weight change.  HENT: Negative.    Eyes: Negative.  Negative for photophobia and visual disturbance.  Respiratory:  Negative for cough, chest tightness and shortness of breath.   Cardiovascular:  Negative for chest pain, palpitations and leg swelling.  Gastrointestinal:  Negative for abdominal pain, blood in stool, constipation, diarrhea, nausea and vomiting.  Endocrine: Negative.  Negative for polydipsia, polyphagia and polyuria.  Genitourinary:  Negative for decreased urine volume, difficulty urinating, dysuria, frequency and urgency.  Musculoskeletal:  Negative for arthralgias and myalgias.  Skin: Negative.   Allergic/Immunologic: Negative.   Neurological:  Negative for dizziness and headaches.  Hematological: Negative.   Psychiatric/Behavioral:  Negative for confusion, hallucinations, sleep disturbance and suicidal ideas.   All other systems reviewed and are negative.       Objective:  BP 123/80   Pulse (!) 57   Temp (!) 97.3 F (36.3 C) (Oral)   Resp 20   Ht  5\' 10"  (1.778 m)   Wt 194 lb 2 oz (88.1 kg)   SpO2 97%   BMI 27.85 kg/m    Wt Readings from Last 3 Encounters:  05/06/23 194 lb 2 oz (88.1 kg)  11/05/22 194 lb 9.6 oz (88.3 kg)  12/26/21 197 lb (89.4 kg)    Physical Exam Vitals and nursing note reviewed.  Constitutional:      General: He is not in acute distress.    Appearance: Normal appearance. He is not ill-appearing, toxic-appearing or diaphoretic.  HENT:     Head: Normocephalic and atraumatic.      Mouth/Throat:     Mouth: Mucous membranes are moist.  Eyes:     Conjunctiva/sclera: Conjunctivae normal.     Pupils: Pupils are equal, round, and reactive to light.  Cardiovascular:     Rate and Rhythm: Normal rate and regular rhythm.     Heart sounds: Normal heart sounds.  Pulmonary:     Effort: Pulmonary effort is normal.     Breath sounds: Normal breath sounds.  Musculoskeletal:     Cervical back: Normal range of motion and neck supple.  Skin:    General: Skin is warm and dry.     Capillary Refill: Capillary refill takes less than 2 seconds.  Neurological:     General: No focal deficit present.     Mental Status: He is alert and oriented to person, place, and time.  Psychiatric:        Mood and Affect: Mood normal.        Behavior: Behavior normal.        Thought Content: Thought content normal.        Judgment: Judgment normal.     Results for orders placed or performed in visit on 11/05/22  CBC with Differential/Platelet  Result Value Ref Range   WBC 3.4 3.4 - 10.8 x10E3/uL   RBC 4.68 4.14 - 5.80 x10E6/uL   Hemoglobin 13.6 13.0 - 17.7 g/dL   Hematocrit 16.1 09.6 - 51.0 %   MCV 88 79 - 97 fL   MCH 29.1 26.6 - 33.0 pg   MCHC 33.2 31.5 - 35.7 g/dL   RDW 04.5 40.9 - 81.1 %   Platelets 211 150 - 450 x10E3/uL   Neutrophils 33 Not Estab. %   Lymphs 57 Not Estab. %   Monocytes 9 Not Estab. %   Eos 1 Not Estab. %   Basos 0 Not Estab. %   Neutrophils Absolute 1.1 (L) 1.4 - 7.0 x10E3/uL   Lymphocytes Absolute 1.9 0.7 - 3.1 x10E3/uL   Monocytes Absolute 0.3 0.1 - 0.9 x10E3/uL   EOS (ABSOLUTE) 0.0 0.0 - 0.4 x10E3/uL   Basophils Absolute 0.0 0.0 - 0.2 x10E3/uL   Immature Granulocytes 0 Not Estab. %   Immature Grans (Abs) 0.0 0.0 - 0.1 x10E3/uL  CMP14+EGFR  Result Value Ref Range   Glucose 96 70 - 99 mg/dL   BUN 15 6 - 24 mg/dL   Creatinine, Ser 9.14 0.76 - 1.27 mg/dL   eGFR 68 >78 GN/FAO/1.30   BUN/Creatinine Ratio 12 9 - 20   Sodium 139 134 - 144 mmol/L   Potassium  4.1 3.5 - 5.2 mmol/L   Chloride 101 96 - 106 mmol/L   CO2 25 20 - 29 mmol/L   Calcium 9.3 8.7 - 10.2 mg/dL   Total Protein 6.2 6.0 - 8.5 g/dL   Albumin 4.4 3.8 - 4.9 g/dL   Globulin, Total 1.8 1.5 - 4.5 g/dL  Albumin/Globulin Ratio 2.4 (H) 1.2 - 2.2   Bilirubin Total 0.4 0.0 - 1.2 mg/dL   Alkaline Phosphatase 61 44 - 121 IU/L   AST 31 0 - 40 IU/L   ALT 25 0 - 44 IU/L  Lipid panel  Result Value Ref Range   Cholesterol, Total 168 100 - 199 mg/dL   Triglycerides 56 0 - 149 mg/dL   HDL 56 >84 mg/dL   VLDL Cholesterol Cal 11 5 - 40 mg/dL   LDL Chol Calc (NIH) 132 (H) 0 - 99 mg/dL   Chol/HDL Ratio 3.0 0.0 - 5.0 ratio  Thyroid Panel With TSH  Result Value Ref Range   TSH 1.650 0.450 - 4.500 uIU/mL   T4, Total 6.8 4.5 - 12.0 ug/dL   T3 Uptake Ratio 27 24 - 39 %   Free Thyroxine Index 1.8 1.2 - 4.9       Pertinent labs & imaging results that were available during my care of the patient were reviewed by me and considered in my medical decision making.  Assessment & Plan:  Keymarion was seen today for medical management of chronic issues.  Diagnoses and all orders for this visit:  Primary hypertension BP well controlled. Changes were not made in regimen today. Goal BP is 130/80. Pt aware to report any persistent high or low readings. DASH diet and exercise encouraged. Exercise at least 150 minutes per week and increase as tolerated. Goal BMI > 25. Stress management encouraged. Avoid nicotine and tobacco product use. Avoid excessive alcohol and NSAID's. Avoid more than 2000 mg of sodium daily. Medications as prescribed. Follow up as scheduled.  -     Basic Metabolic Panel  Screening for colorectal cancer Referral to GI placed.  -     New Ambulatory referral to Gastroenterology     Continue all other maintenance medications.  Follow up plan: Return in about 1 year (around 05/05/2024) for CPE.   Continue healthy lifestyle choices, including diet (rich in fruits, vegetables, and lean  proteins, and low in salt and simple carbohydrates) and exercise (at least 30 minutes of moderate physical activity daily).  Educational handout given for dash diet   The above assessment and management plan was discussed with the patient. The patient verbalized understanding of and has agreed to the management plan. Patient is aware to call the clinic if they develop any new symptoms or if symptoms persist or worsen. Patient is aware when to return to the clinic for a follow-up visit. Patient educated on when it is appropriate to go to the emergency department.   Maryelizabeth Kaufmann NP student [facility] [phone]   I personally was present during the history, physical exam, and medical decision-making activities of this visit and have verified that the services and findings are accurately documented in the nurse practitioner student's note.  Kari Baars, FNP-C [facility] [address] [place], [place] [address] [phone]

## 2023-05-06 NOTE — Patient Instructions (Signed)
GI will Call you for appointment  Goal BP:  For patients younger than 60: Goal BP < 140/90. For patients 60 and older: Goal BP < 150/90. For patients with diabetes: Goal BP < 140/90.  Take your medications faithfully as prescribed. Maintain a healthy weight. Get at least 150 minutes of aerobic exercise per week. Minimize salt intake, less than 2000 mg per day. Minimize alcohol intake.  DASH Eating Plan DASH stands for "Dietary Approaches to Stop Hypertension." The DASH eating plan is a healthy eating plan that has been shown to reduce high blood pressure (hypertension). Additional health benefits may include reducing the risk of type 2 diabetes mellitus, heart disease, and stroke. The DASH eating plan may also help with weight loss.  WHAT DO I NEED TO KNOW ABOUT THE DASH EATING PLAN? For the DASH eating plan, you will follow these general guidelines: Choose foods with a percent daily value for sodium of less than 5% (as listed on the food label). Use salt-free seasonings or herbs instead of table salt or sea salt. Check with your health care provider or pharmacist before using salt substitutes. Eat lower-sodium products, often labeled as "lower sodium" or "no salt added." Eat fresh foods. Eat more vegetables, fruits, and low-fat dairy products. Choose whole grains. Look for the word "whole" as the first word in the ingredient list. Choose fish and skinless chicken or [place] more often than red meat. Limit fish, poultry, and meat to 6 oz (170 g) each day. Limit sweets, desserts, sugars, and sugary drinks. Choose heart-healthy fats. Limit cheese to 1 oz (28 g) per day. Eat more home-cooked food and less restaurant, buffet, and fast food. Limit fried foods. Cook foods using methods other than frying. Limit canned vegetables. If you do use them, rinse them well to decrease the sodium. When eating at a restaurant, ask that your food be prepared with less salt, or no salt if  possible.  WHAT FOODS CAN I EAT? Seek help from a dietitian for individual calorie needs.  Grains Whole grain or whole wheat bread. Brown rice. Whole grain or whole wheat pasta. Quinoa, bulgur, and whole grain cereals. Low-sodium cereals. Corn or whole wheat flour tortillas. Whole grain cornbread. Whole grain crackers. Low-sodium crackers.  Vegetables Fresh or frozen vegetables (raw, steamed, roasted, or grilled). Low-sodium or reduced-sodium tomato and vegetable juices. Low-sodium or reduced-sodium tomato sauce and paste. Low-sodium or reduced-sodium canned vegetables.   Fruits All fresh, canned (in natural juice), or frozen fruits.  Meat and Other Protein Products Ground beef (85% or leaner), grass-fed beef, or beef trimmed of fat. Skinless chicken or [place]. Ground chicken or [place]. Pork trimmed of fat. All fish and seafood. Eggs. Dried beans, peas, or lentils. Unsalted nuts and seeds. Unsalted canned beans.  Dairy Low-fat dairy products, such as skim or 1% milk, 2% or reduced-fat cheeses, low-fat ricotta or cottage cheese, or plain low-fat yogurt. Low-sodium or reduced-sodium cheeses.  Fats and Oils Tub margarines without trans fats. Light or reduced-fat mayonnaise and salad dressings (reduced sodium). Avocado. Safflower, olive, or canola oils. Natural peanut or almond butter.  Other Unsalted popcorn and pretzels. The items listed above may not be a complete list of recommended foods or beverages. Contact your dietitian for more options.  WHAT FOODS ARE NOT RECOMMENDED?  Grains White bread. White pasta. White rice. Refined cornbread. Bagels and croissants. Crackers that contain trans fat.  Vegetables Creamed or fried vegetables. Vegetables in a cheese sauce. Regular canned vegetables. Regular canned tomato sauce and  paste. Regular tomato and vegetable juices.  Fruits Dried fruits. Canned fruit in light or heavy syrup. Fruit juice.  Meat and Other Protein Products Fatty  cuts of meat. Ribs, chicken wings, bacon, sausage, bologna, salami, chitterlings, fatback, hot dogs, bratwurst, and packaged luncheon meats. Salted nuts and seeds. Canned beans with salt.  Dairy Whole or 2% milk, cream, half-and-half, and cream cheese. Whole-fat or sweetened yogurt. Full-fat cheeses or blue cheese. Nondairy creamers and whipped toppings. Processed cheese, cheese spreads, or cheese curds.  Condiments Onion and garlic salt, seasoned salt, table salt, and sea salt. Canned and packaged gravies. Worcestershire sauce. Tartar sauce. Barbecue sauce. Teriyaki sauce. Soy sauce, including reduced sodium. Steak sauce. Fish sauce. Oyster sauce. Cocktail sauce. Horseradish. Ketchup and mustard. Meat flavorings and tenderizers. Bouillon cubes. Hot sauce. Tabasco sauce. Marinades. Taco seasonings. Relishes.  Fats and Oils Butter, stick margarine, lard, shortening, ghee, and bacon fat. Coconut, palm kernel, or palm oils. Regular salad dressings.  Other Pickles and olives. Salted popcorn and pretzels.  The items listed above may not be a complete list of foods and beverages to avoid. Contact your dietitian for more information.  WHERE CAN I FIND MORE INFORMATION? National Heart, Lung, and Blood Institute: [email or web address] Document Released: 09/26/2011 Document Revised: 02/21/2014 Document Reviewed: 08/11/2013 [facility] Patient Information 2015 [place], [place]. This information is not intended to replace advice given to you by your health care provider. Make sure you discuss any questions you have with your health care provider.   I think that you would greatly benefit from seeing a nutritionist.  If you are interested, please call Dr. Gerilyn Pilgrim at [phone] to schedule an appointment.

## 2023-10-28 ENCOUNTER — Other Ambulatory Visit: Payer: Self-pay | Admitting: Family Medicine

## 2023-10-28 DIAGNOSIS — I10 Essential (primary) hypertension: Secondary | ICD-10-CM

## 2024-05-06 ENCOUNTER — Encounter: Payer: No Typology Code available for payment source | Admitting: Family Medicine

## 2024-05-06 ENCOUNTER — Encounter: Payer: Self-pay | Admitting: Family Medicine

## 2024-05-06 ENCOUNTER — Ambulatory Visit (INDEPENDENT_AMBULATORY_CARE_PROVIDER_SITE_OTHER): Payer: Self-pay | Admitting: Family Medicine

## 2024-05-06 VITALS — BP 109/71 | HR 50 | Temp 97.5°F | Ht 70.0 in | Wt 185.6 lb

## 2024-05-06 DIAGNOSIS — Z0001 Encounter for general adult medical examination with abnormal findings: Secondary | ICD-10-CM | POA: Diagnosis not present

## 2024-05-06 DIAGNOSIS — Z114 Encounter for screening for human immunodeficiency virus [HIV]: Secondary | ICD-10-CM

## 2024-05-06 DIAGNOSIS — Z1211 Encounter for screening for malignant neoplasm of colon: Secondary | ICD-10-CM

## 2024-05-06 DIAGNOSIS — I10 Essential (primary) hypertension: Secondary | ICD-10-CM | POA: Diagnosis not present

## 2024-05-06 DIAGNOSIS — Z136 Encounter for screening for cardiovascular disorders: Secondary | ICD-10-CM

## 2024-05-06 DIAGNOSIS — Z87898 Personal history of other specified conditions: Secondary | ICD-10-CM | POA: Diagnosis not present

## 2024-05-06 DIAGNOSIS — Z1159 Encounter for screening for other viral diseases: Secondary | ICD-10-CM

## 2024-05-06 DIAGNOSIS — Z0000 Encounter for general adult medical examination without abnormal findings: Secondary | ICD-10-CM

## 2024-05-06 DIAGNOSIS — Z130 Encounter for screening for diseases of the blood and blood-forming organs and certain disorders involving the immune mechanism: Secondary | ICD-10-CM

## 2024-05-06 LAB — LIPID PANEL

## 2024-05-06 MED ORDER — LISINOPRIL-HYDROCHLOROTHIAZIDE 10-12.5 MG PO TABS: 1.0000 | ORAL_TABLET | Freq: Every day | ORAL | 1 refills | Status: DC

## 2024-05-06 NOTE — Progress Notes (Signed)
 Complete physical exam  Patient: Barry Conway   DOB: 05/08/1971   52 y.o. Male  MRN: [id number]  Subjective:    Chief Complaint  Patient presents with   Annual Exam   Barry Conway is a 53 year old male who presents for a routine follow-up visit.  He has been feeling well with no new health issues since his last visit. He continues to work full-time and maintains a regular exercise routine, going to the gym four days a week. No headaches, chest pain, leg swelling, changes in urine output, vision or hearing changes, palpitations, or gastrointestinal symptoms such as constipation or diarrhea. He reports sleeping well.  He is still taking his blood pressure medication as prescribed with no issues.  Regarding his prosthetic leg, he has not needed a new device in the past three years and has not required any adjustments. No sores or issues with the prosthetic.  He has no family history of prostate or colorectal cancer. He has not undergone a colonoscopy yet. He previously received a Cologuard kit but did not use it.  He had chickenpox as an adult at the age of 55, which he describes as a severe experience.      Barry Conway is a 53 y.o. male who presents today for a complete physical exam. He reports consuming a general diet. Gym/ health club routine includes cardio and mod to heavy weightlifting. He generally feels well. He reports sleeping well. He does not have additional problems to discuss today.    Most recent fall risk assessment:    05/06/2024    8:42 AM  Fall Risk   Falls in the past year? 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 No pertinent past medical history. Past Surgical History:  Procedure Laterality Date   BACK SURGERY  2001   HERNIA REPAIR     LEG SURGERY  2001   Social History   Tobacco Use   Smoking status: Never   Smokeless tobacco: Never  Vaping Use   Vaping status: Never Used  Substance Use Topics   Alcohol use: No   Drug use: Never   Social History   Socioeconomic History   Marital status: Married    Spouse name: Not on file   Number of children: Not on file   Years of education: Not on file   Highest education level: Some college, no degree  Occupational History   Not on file  Tobacco Use   Smoking status: Never   Smokeless tobacco: Never  Vaping Use   Vaping status: Never Used  Substance and Sexual Activity   Alcohol use: No   Drug use: Never   Sexual activity: Not on file  Other Topics Concern   Not on file  Social History Narrative   Not on file   Social Drivers of Health   Financial Resource Strain: Low Risk  (05/02/2024)   Overall Financial Resource Strain (CARDIA)    Difficulty of Paying Living Expenses: Not very hard  Food Insecurity: No Food  Insecurity (05/02/2024)   Hunger Vital Sign    Worried About Running Out of Food in the Last Year: Never true    Ran Out of Food in the Last Year: Never true  Transportation Needs: No Transportation Needs (05/02/2024)   PRAPARE - Administrator, Civil Service (Medical): No    Lack of Transportation (Non-Medical): No  Physical Activity: Sufficiently Active (05/02/2024)   Exercise Vital Sign    Days of Exercise per Week: 5 days    Minutes of Exercise per Session: 90 min  Stress: No Stress Concern Present (05/02/2024)   [employer] of Occupational Health - Occupational Stress Questionnaire    Feeling of Stress: Not at all  Social Connections: Moderately Isolated (05/02/2024)   Social Connection and Isolation Panel    Frequency of Communication with Friends and Family: More than three  times a week    Frequency of Social Gatherings with Friends and Family: More than three times a week    Attends Religious Services: 1 to 4 times per year    Active Member of [employer] or Organizations: No    Attends Engineer, structural: Not on file    Marital Status: Divorced  Catering manager Violence: Not on file   Family Status  Relation Name Status   Mother  Alive   Father  Alive   Sister 2 Alive   Brother  Alive  No partnership data on file   History reviewed. No pertinent family history. No Known Allergies    Patient Care Team: Severa Rock HERO, FNP as PCP - General (Family Medicine)   Outpatient Medications Prior to Visit  Medication Sig   [DISCONTINUED] lisinopril -hydrochlorothiazide  (ZESTORETIC ) 10-12.5 MG tablet TAKE 1 TABLET BY MOUTH EVERY DAY   No facility-administered medications prior to visit.    ROS per HPI     Objective:     BP 109/71   Pulse (!) 50   Temp (!) 97.5 F (36.4 C)   Ht 5' 10 (1.778 m)   Wt 185 lb 9.6 oz (84.2 kg)   SpO2 99%   BMI 26.63 kg/m  BP Readings from Last 3 Encounters:  05/06/24 109/71  05/06/23 123/80  11/05/22 129/79   Wt Readings from Last 3 Encounters:  05/06/24 185 lb 9.6 oz (84.2 kg)  05/06/23 194 lb 2 oz (88.1 kg)  11/05/22 194 lb 9.6 oz (88.3 kg)   SpO2 Readings from Last 3 Encounters:  05/06/24 99%  05/06/23 97%  11/05/22 100%      Physical Exam Vitals and nursing note reviewed.  Constitutional:      General: He is not in acute distress.    Appearance: Normal appearance. He is well-developed and well-groomed. He is obese. He is not ill-appearing, toxic-appearing or diaphoretic.  HENT:     Head: Normocephalic and atraumatic.     Jaw: There is normal jaw occlusion.     Right Ear: Hearing, tympanic membrane, ear canal and external ear normal.     Left Ear: Hearing, tympanic membrane, ear canal and external ear normal.     Nose: Nose normal.     Mouth/Throat:     Lips: Pink.     Mouth: Mucous  membranes are moist.     Pharynx: Oropharynx is clear. Uvula midline.  Eyes:     General: Lids are normal.     Extraocular Movements: Extraocular movements intact.     Conjunctiva/sclera: Conjunctivae normal.     Pupils: Pupils are equal, round, and reactive  to light.  Neck:     Thyroid : No thyroid  mass, thyromegaly or thyroid  tenderness.     Vascular: No carotid bruit or JVD.     Trachea: Trachea and phonation normal.  Cardiovascular:     Rate and Rhythm: Regular rhythm. Bradycardia present.     Chest Wall: PMI is not displaced.     Heart sounds: Normal heart sounds. No murmur heard.    No friction rub. No gallop.  Pulmonary:     Effort: Pulmonary effort is normal. No respiratory distress.     Breath sounds: Normal breath sounds. No wheezing.  Chest:     Chest wall: No mass.  Breasts:    Breasts are symmetrical.  Abdominal:     General: Abdomen is flat. Bowel sounds are normal. There is no distension or abdominal bruit.     Palpations: Abdomen is soft. There is no hepatomegaly or splenomegaly.     Tenderness: There is no abdominal tenderness. There is no right CVA tenderness or left CVA tenderness.     Hernia: No hernia is present.  Musculoskeletal:        General: Normal range of motion.     Cervical back: Full passive range of motion without pain, normal range of motion and neck supple.     Right lower leg: No edema.     Comments: L BKA  Feet:     Right foot:     Skin integrity: Skin integrity normal.     Toenail Condition: Right toenails are normal.     Left foot:     Skin integrity: Skin integrity normal.     Toenail Condition: Left toenails are normal.  Lymphadenopathy:     Cervical: No cervical adenopathy.     Upper Body:     Right upper body: No supraclavicular, axillary or pectoral adenopathy.     Left upper body: No supraclavicular, axillary or pectoral adenopathy.  Skin:    General: Skin is warm and dry.     Capillary Refill: Capillary refill takes less than  2 seconds.     Coloration: Skin is not cyanotic, jaundiced or pale.     Findings: No rash.  Neurological:     General: No focal deficit present.     Mental Status: He is alert and oriented to person, place, and time.     Sensory: Sensation is intact.     Motor: Motor function is intact.     Coordination: Coordination is intact.     Gait: Gait is intact.     Deep Tendon Reflexes: Reflexes are normal and symmetric.  Psychiatric:        Attention and Perception: Attention and perception normal.        Mood and Affect: Mood and affect normal.        Speech: Speech normal.        Behavior: Behavior normal. Behavior is cooperative.        Thought Content: Thought content normal.        Cognition and Memory: Cognition and memory normal.        Judgment: Judgment normal.       Last CBC Lab Results  Component Value Date   WBC 3.4 11/05/2022   HGB 13.6 11/05/2022   HCT 41.0 11/05/2022   MCV 88 11/05/2022   MCH 29.1 11/05/2022   RDW 12.8 11/05/2022   PLT 211 11/05/2022   Last metabolic panel Lab Results  Component Value Date   GLUCOSE 91 05/06/2023  NA 136 05/06/2023   K 4.3 05/06/2023   CL 100 05/06/2023   CO2 25 05/06/2023   BUN 21 05/06/2023   CREATININE 1.21 05/06/2023   EGFR 72 05/06/2023   CALCIUM 9.7 05/06/2023   PROT 6.2 11/05/2022   ALBUMIN 4.4 11/05/2022   LABGLOB 1.8 11/05/2022   AGRATIO 2.4 (H) 11/05/2022   BILITOT 0.4 11/05/2022   ALKPHOS 61 11/05/2022   AST 31 11/05/2022   ALT 25 11/05/2022   Last lipids Lab Results  Component Value Date   CHOL 168 11/05/2022   HDL 56 11/05/2022   LDLCALC 101 (H) 11/05/2022   TRIG 56 11/05/2022   CHOLHDL 3.0 11/05/2022   Last thyroid  functions Lab Results  Component Value Date   TSH 1.650 11/05/2022   T4TOTAL 6.8 11/05/2022       Assessment & Plan:    Routine Health Maintenance and Physical Exam  Immunization History  Administered Date(s) Administered   Tdap 01/08/2019, 03/18/2019, 12/26/2021     Health Maintenance  Topic Date Due   HIV Screening  Never done   Hepatitis C Screening  Never done   Colonoscopy  Never done   COVID-19 Vaccine (1 - 2024-25 season) 05/22/2024 (Originally 06/22/2023)   Zoster Vaccines- Shingrix (1 of 2) 08/06/2024 (Originally 06/24/2020)   Hepatitis B Vaccines (1 of 3 - 19+ 3-dose series) 05/06/2025 (Originally 06/24/1989)   INFLUENZA VACCINE  05/21/2024   DTaP/Tdap/Td (4 - Td or Tdap) 12/27/2031   HPV VACCINES  Aged Out   Meningococcal B Vaccine  Aged Out    Discussed health benefits of physical activity, and encouraged him to engage in regular exercise appropriate for his age and condition.  Problem List Items Addressed This Visit       Cardiovascular and Mediastinum   Hypertension   Relevant Medications   lisinopril -hydrochlorothiazide  (ZESTORETIC ) 10-12.5 MG tablet   Other Relevant Orders   CBC with Differential/Platelet   CMP14+EGFR   Lipid panel   Thyroid  Panel With TSH   Other Visit Diagnoses       Annual physical exam    -  Primary   Relevant Orders   CBC with Differential/Platelet   CMP14+EGFR   Ambulatory referral to Gastroenterology     Screening for colorectal cancer       Relevant Orders   Ambulatory referral to Gastroenterology     Encounter for screening for cardiovascular disorders       Relevant Orders   CBC with Differential/Platelet   CMP14+EGFR   Lipid panel     Screening for endocrine, nutritional, metabolic and immunity disorder       Relevant Orders   CBC with Differential/Platelet   CMP14+EGFR   Lipid panel   Thyroid  Panel With TSH     History of nocturia       Relevant Orders   PSA, total and free     Need for hepatitis C screening test       Relevant Orders   Hepatitis C antibody     Screening for HIV (human immunodeficiency virus)       Relevant Orders   HIV antibody (with reflex)     Need for hepatitis B screening test       Relevant Orders   Hepatitis B surface antibody,qualitative          Hypertension Blood pressure is well-controlled with current medication regimen. No symptoms such as headaches, chest pain, or leg swelling. No changes in urine output or nocturia. - Continue current antihypertensive  medication regimen.  Prosthetic Leg Management He manages adjustments himself if needed, but has not required any.  Colorectal Cancer Screening No family history of colorectal cancer. Prefers colonoscopy over Cologuard for screening, acknowledging it as the gold standard. Discussed that if abnormalities are found with Cologuard, a colonoscopy would still be necessary. - Schedule colonoscopy for colorectal cancer screening.  General Health Maintenance He is physically active, exercises four days a week, and maintains a healthy lifestyle. No vision or hearing changes. Blood pressure and heart rate are within normal limits. Discussed the importance of shingles vaccination due to chickenpox. Hepatitis B immunity to be checked. - Perform blood work including Hepatitis B titers.       Return in about 1 year (around 05/06/2025) for Annual Physical.     Rosaline Bruns, FNP

## 2024-05-07 ENCOUNTER — Ambulatory Visit: Payer: Self-pay | Admitting: Family Medicine

## 2024-05-07 LAB — CMP14+EGFR
ALT: 23 IU/L (ref 0–44)
AST: 33 IU/L (ref 0–40)
Albumin: 4.5 g/dL (ref 3.8–4.9)
Alkaline Phosphatase: 60 IU/L (ref 44–121)
BUN/Creatinine Ratio: 16 (ref 9–20)
BUN: 22 mg/dL (ref 6–24)
Bilirubin Total: 0.3 mg/dL (ref 0.0–1.2)
CO2: 21 mmol/L (ref 20–29)
Calcium: 9.7 mg/dL (ref 8.7–10.2)
Chloride: 104 mmol/L (ref 96–106)
Creatinine, Ser: 1.35 mg/dL — ABNORMAL HIGH (ref 0.76–1.27)
Globulin, Total: 2 g/dL (ref 1.5–4.5)
Glucose: 96 mg/dL (ref 70–99)
Potassium: 4.8 mmol/L (ref 3.5–5.2)
Sodium: 142 mmol/L (ref 134–144)
Total Protein: 6.5 g/dL (ref 6.0–8.5)
eGFR: 63 mL/min/1.73 (ref >59)

## 2024-05-07 LAB — THYROID PANEL WITH TSH
Free Thyroxine Index: 2.5 (ref 1.2–4.9)
T3 Uptake Ratio: 33 % (ref 24–39)
T4, Total: 7.7 ug/dL (ref 4.5–12.0)
TSH: 0.979 u[IU]/mL (ref 0.450–4.500)

## 2024-05-07 LAB — CBC WITH DIFFERENTIAL/PLATELET
Basophils Absolute: 0 x10E3/uL (ref 0.0–0.2)
Basos: 1 %
EOS (ABSOLUTE): 0.1 x10E3/uL (ref 0.0–0.4)
Eos: 3 %
Hematocrit: 43.2 % (ref 37.5–51.0)
Hemoglobin: 14 g/dL (ref 13.0–17.7)
Immature Grans (Abs): 0 x10E3/uL (ref 0.0–0.1)
Immature Granulocytes: 0 %
Lymphocytes Absolute: 1.5 x10E3/uL (ref 0.7–3.1)
Lymphs: 54 %
MCH: 29.4 pg (ref 26.6–33.0)
MCHC: 32.4 g/dL (ref 31.5–35.7)
MCV: 91 fL (ref 79–97)
Monocytes Absolute: 0.3 x10E3/uL (ref 0.1–0.9)
Monocytes: 11 %
Neutrophils Absolute: 0.8 x10E3/uL — ABNORMAL LOW (ref 1.4–7.0)
Neutrophils: 31 %
Platelets: 207 x10E3/uL (ref 150–450)
RBC: 4.77 x10E6/uL (ref 4.14–5.80)
RDW: 13 % (ref 11.6–15.4)
WBC: 2.7 x10E3/uL — ABNORMAL LOW (ref 3.4–10.8)

## 2024-05-07 LAB — LIPID PANEL
Chol/HDL Ratio: 2.8 ratio (ref 0.0–5.0)
Cholesterol, Total: 179 mg/dL (ref 100–199)
HDL: 65 mg/dL (ref >39)
LDL Chol Calc (NIH): 103 mg/dL — ABNORMAL HIGH (ref 0–99)
Triglycerides: 56 mg/dL (ref 0–149)
VLDL Cholesterol Cal: 11 mg/dL (ref 5–40)

## 2024-05-07 LAB — HEPATITIS C ANTIBODY: Hep C Virus Ab: NONREACTIVE

## 2024-05-07 LAB — PSA, TOTAL AND FREE
PSA, Free Pct: 23.3 %
PSA, Free: 0.14 ng/mL
Prostate Specific Ag, Serum: 0.6 ng/mL (ref 0.0–4.0)

## 2024-05-07 LAB — HEPATITIS B SURFACE ANTIBODY,QUALITATIVE: Hep B Surface Ab, Qual: NONREACTIVE

## 2024-05-07 LAB — HIV ANTIBODY (ROUTINE TESTING W REFLEX): HIV Screen 4th Generation wRfx: NONREACTIVE

## 2024-05-10 ENCOUNTER — Telehealth: Payer: Self-pay | Admitting: Family Medicine

## 2024-05-10 DIAGNOSIS — N289 Disorder of kidney and ureter, unspecified: Secondary | ICD-10-CM

## 2024-05-10 DIAGNOSIS — D72829 Elevated white blood cell count, unspecified: Secondary | ICD-10-CM

## 2024-05-10 NOTE — Telephone Encounter (Signed)
 Called pt and scheduled for lab only next week and future orders placed. Pt states he spoke with someone regarding labs already and doesn't have any questions.

## 2024-05-10 NOTE — Telephone Encounter (Signed)
 Copied from CRM [id number]. Topic: Clinical - Lab/Test Results >> May 10, 2024 10:38 AM Barry Conway wrote: Reason for CRM:  Dam is on vacation and will return next week. Please call him to schedule a lab appointment for next week. No lab orders are in his chart. His number is [phone]. I did review lab results from chart dated 05/07/24.  He has no questions at this time.  Thanks

## 2024-05-17 ENCOUNTER — Other Ambulatory Visit

## 2024-05-17 DIAGNOSIS — N289 Disorder of kidney and ureter, unspecified: Secondary | ICD-10-CM

## 2024-05-17 DIAGNOSIS — D72829 Elevated white blood cell count, unspecified: Secondary | ICD-10-CM

## 2024-05-17 LAB — CMP14+EGFR
ALT: 49 IU/L — ABNORMAL HIGH (ref 0–44)
AST: 31 IU/L (ref 0–40)
Albumin: 4.3 g/dL (ref 3.8–4.9)
Alkaline Phosphatase: 61 IU/L (ref 44–121)
BUN/Creatinine Ratio: 15 (ref 9–20)
BUN: 22 mg/dL (ref 6–24)
Bilirubin Total: 0.4 mg/dL (ref 0.0–1.2)
CO2: 22 mmol/L (ref 20–29)
Calcium: 9.4 mg/dL (ref 8.7–10.2)
Chloride: 103 mmol/L (ref 96–106)
Creatinine, Ser: 1.42 mg/dL — ABNORMAL HIGH (ref 0.76–1.27)
Globulin, Total: 2.1 g/dL (ref 1.5–4.5)
Glucose: 93 mg/dL (ref 70–99)
Potassium: 4.3 mmol/L (ref 3.5–5.2)
Sodium: 140 mmol/L (ref 134–144)
Total Protein: 6.4 g/dL (ref 6.0–8.5)
eGFR: 59 mL/min/1.73 — ABNORMAL LOW (ref >59)

## 2024-05-17 LAB — CBC WITH DIFFERENTIAL/PLATELET
Basophils Absolute: 0 x10E3/uL (ref 0.0–0.2)
Basos: 1 %
EOS (ABSOLUTE): 0.1 x10E3/uL (ref 0.0–0.4)
Eos: 2 %
Hematocrit: 42.5 % (ref 37.5–51.0)
Hemoglobin: 14 g/dL (ref 13.0–17.7)
Immature Grans (Abs): 0 x10E3/uL (ref 0.0–0.1)
Immature Granulocytes: 0 %
Lymphocytes Absolute: 1.7 x10E3/uL (ref 0.7–3.1)
Lymphs: 52 %
MCH: 29.7 pg (ref 26.6–33.0)
MCHC: 32.9 g/dL (ref 31.5–35.7)
MCV: 90 fL (ref 79–97)
Monocytes Absolute: 0.3 x10E3/uL (ref 0.1–0.9)
Monocytes: 10 %
Neutrophils Absolute: 1.1 x10E3/uL — ABNORMAL LOW (ref 1.4–7.0)
Neutrophils: 35 %
Platelets: 198 x10E3/uL (ref 150–450)
RBC: 4.71 x10E6/uL (ref 4.14–5.80)
RDW: 12.6 % (ref 11.6–15.4)
WBC: 3.3 x10E3/uL — ABNORMAL LOW (ref 3.4–10.8)

## 2024-05-18 ENCOUNTER — Ambulatory Visit: Payer: Self-pay | Admitting: Family Medicine

## 2024-05-18 ENCOUNTER — Other Ambulatory Visit: Payer: Self-pay

## 2024-05-18 DIAGNOSIS — N289 Disorder of kidney and ureter, unspecified: Secondary | ICD-10-CM

## 2024-05-24 ENCOUNTER — Ambulatory Visit: Payer: Self-pay

## 2024-05-24 NOTE — Telephone Encounter (Signed)
 Contacted patient. Answered questions to the best of my ability.

## 2024-05-24 NOTE — Telephone Encounter (Signed)
 FYI Only or Action Required?: Action required by provider: clinical question for provider.  Patient was last seen in primary care on 05/06/2024 by Severa Rock HERO, FNP.  Called Nurse Triage reporting Results.  Symptoms began n/a.  Interventions attempted: Rest, hydration, or home remedies.  Symptoms are: stable.  Triage Disposition: Call PCP When Office is Open  Patient/caregiver understands and will follow disposition?: Yes             Copied from CRM [id number]. Topic: Clinical - Lab/Test Results >> May 24, 2024 11:27 AM Corin V wrote: Patient has questions about lab results. He realized he didn't fully understand results and wants to know what they mean in detail Reason for Disposition  [1] Caller requesting NON-URGENT health information AND [2] PCP's office is the best resource  Answer Assessment - Initial Assessment Questions 1. REASON FOR CALL: What is the main reason for your call? or How can I best help you?     Wanting to know what is causing kidney function to worsen. Concerned about lab work and what is on the differential dx.  2. SYMPTOMS : Do you have any symptoms?      Muscle cramps  3. OTHER QUESTIONS: Do you have any other questions?     no  Protocols used: Information Only Call - No Triage-A-AH

## 2024-05-31 ENCOUNTER — Other Ambulatory Visit

## 2024-05-31 DIAGNOSIS — N289 Disorder of kidney and ureter, unspecified: Secondary | ICD-10-CM

## 2024-05-31 LAB — CMP14+EGFR
ALT: 32 IU/L (ref 0–44)
AST: 40 IU/L (ref 0–40)
Albumin: 4.1 g/dL (ref 3.8–4.9)
Alkaline Phosphatase: 60 IU/L (ref 44–121)
BUN/Creatinine Ratio: 11 (ref 9–20)
BUN: 15 mg/dL (ref 6–24)
Bilirubin Total: 0.2 mg/dL (ref 0.0–1.2)
CO2: 23 mmol/L (ref 20–29)
Calcium: 9.2 mg/dL (ref 8.7–10.2)
Chloride: 103 mmol/L (ref 96–106)
Creatinine, Ser: 1.41 mg/dL — ABNORMAL HIGH (ref 0.76–1.27)
Globulin, Total: 2 g/dL (ref 1.5–4.5)
Glucose: 81 mg/dL (ref 70–99)
Potassium: 4.5 mmol/L (ref 3.5–5.2)
Sodium: 139 mmol/L (ref 134–144)
Total Protein: 6.1 g/dL (ref 6.0–8.5)
eGFR: 60 mL/min/1.73 (ref >59)

## 2024-06-01 ENCOUNTER — Ambulatory Visit: Payer: Self-pay | Admitting: Family Medicine

## 2024-08-16 ENCOUNTER — Ambulatory Visit: Payer: Self-pay

## 2024-08-16 NOTE — Telephone Encounter (Signed)
 Appt made.

## 2024-08-16 NOTE — Telephone Encounter (Signed)
 FYI Only or Action Required?: FYI only for provider.  Patient was last seen in primary care on 05/06/2024 by Severa Rock HERO, FNP.  Called Nurse Triage reporting Leg Swelling.  Symptoms began ongoing and worsening.  Interventions attempted: OTC medications: tylenol.  Symptoms are: gradually worsening.  Triage Disposition: See Physician Within 24 Hours  Patient/caregiver understands and will follow disposition?: Yes   Copied from CRM #[id number]. Topic: Clinical - Red Word Triage >> Aug 16, 2024  2:02 PM Geneva B wrote: [facility] that prompted transfer to Nurse Triage: rt knee pain and swelling Reason for Disposition  [1] MODERATE leg swelling (e.g., swelling extends up to knees) AND [2] new-onset or getting worse  Answer Assessment - Initial Assessment Questions 1. ONSET: When did the swelling start? (e.g., minutes, hours, days)     X 4 days 2. LOCATION: What part of the leg is swollen?  Are both legs swollen or just one leg?     Right knee 3. SEVERITY: How bad is the swelling? (e.g., localized; mild, moderate, severe)     Severe  4. REDNESS: Is there redness or signs of infection?     no 5. PAIN: Is the swelling painful to touch? If Yes, ask: How painful is it?   (Scale 1-10; mild, moderate or severe)     7/10 6. FEVER: Do you have a fever? If Yes, ask: What is it, how was it measured, and when did it start?      no 7. CAUSE: What do you think is causing the leg swelling?     unknown 8. MEDICAL HISTORY: Do you have a history of blood clots (e.g., DVT), cancer, heart failure, kidney disease, or liver failure?     na 9. RECURRENT SYMPTOM: Have you had leg swelling before? If Yes, ask: When was the last time? What happened that time?     na 10. OTHER SYMPTOMS: Do you have any other symptoms? (e.g., chest pain, difficulty breathing)       no 11. PREGNANCY: Is there any chance you are pregnant? When was your last menstrual period?        na  Protocols used: Leg Swelling and Edema-A-AH

## 2024-08-17 ENCOUNTER — Ambulatory Visit: Payer: Self-pay | Admitting: Family Medicine

## 2024-08-17 ENCOUNTER — Ambulatory Visit (INDEPENDENT_AMBULATORY_CARE_PROVIDER_SITE_OTHER): Payer: Self-pay | Admitting: Family Medicine

## 2024-08-17 ENCOUNTER — Ambulatory Visit (INDEPENDENT_AMBULATORY_CARE_PROVIDER_SITE_OTHER)

## 2024-08-17 ENCOUNTER — Encounter: Payer: Self-pay | Admitting: Family Medicine

## 2024-08-17 VITALS — BP 134/80 | HR 58 | Temp 97.2°F | Ht 70.0 in | Wt 181.8 lb

## 2024-08-17 DIAGNOSIS — I10 Essential (primary) hypertension: Secondary | ICD-10-CM | POA: Diagnosis not present

## 2024-08-17 DIAGNOSIS — M25561 Pain in right knee: Secondary | ICD-10-CM

## 2024-08-17 DIAGNOSIS — G8929 Other chronic pain: Secondary | ICD-10-CM

## 2024-08-17 MED ORDER — PREDNISONE 20 MG PO TABS: 40.0000 mg | ORAL_TABLET | Freq: Every day | ORAL | 0 refills | Status: AC

## 2024-08-17 NOTE — Progress Notes (Signed)
 Subjective:  Patient ID: Barry Conway, male    DOB: 04/21/1971, 53 y.o.   MRN: [id number]  Patient Care Team: Severa Rock HERO, FNP as PCP - General (Family Medicine)   Chief Complaint:  Knee Pain (Right x 4 days )   HPI: Barry Conway is a 53 y.o. male presenting on 08/17/2024 for Knee Pain (Right x 4 days )   Discussed the use of AI scribe software for clinical note transcription with the patient, who gave verbal consent to proceed.  History of Present Illness   Barry Conway is a 53 year old male who presents with a hand injury and knee pain.  He sustained a recent hand injury while working with metal rods, resulting in a fracture and requiring stitches, which have since been removed. He notes persistent swelling and soreness in the hand and is scheduled for a follow-up next week.  He has ongoing issues with his knee, which have worsened since discontinuing Celebrex due to declining kidney function. Over the past four days, he has experienced increased swelling and pain around the knee, particularly on the sides and front. He describes a sensation of instability, especially when using stairs, and mentions a lack of feeling on the inside of his kneecap due to a previous back injury in 2001 that resulted in nerve damage and loss of sensation in his lower leg. He has not undergone physical therapy for the knee and last had x-rays a year ago. He has received two injections in the past, which provided temporary relief. He relies heavily on this knee due to the other leg being compromised from the previous injury.  He is currently not using any topical treatments for the knee but is open to trying Biofreeze. He is also concerned about his kidney function after stopping Celebrex.          Relevant past medical, surgical, family, and social history reviewed and updated as indicated.  Allergies and medications reviewed and updated. Data reviewed: Chart in Epic.   History reviewed. No  pertinent past medical history.  Past Surgical History:  Procedure Laterality Date   BACK SURGERY  2001   HERNIA REPAIR     LEG SURGERY  2001    Social History   Socioeconomic History   Marital status: Married    Spouse name: Not on file   Number of children: Not on file   Years of education: Not on file   Highest education level: Some college, no degree  Occupational History   Not on file  Tobacco Use   Smoking status: Never   Smokeless tobacco: Never  Vaping Use   Vaping status: Never Used  Substance and Sexual Activity   Alcohol use: No   Drug use: Never   Sexual activity: Not on file  Other Topics Concern   Not on file  Social History Narrative   Not on file   Social Drivers of Health   Financial Resource Strain: Low Risk  (05/02/2024)   Overall Financial Resource Strain (CARDIA)    Difficulty of Paying Living Expenses: Not very hard  Food Insecurity: No Food Insecurity (05/02/2024)   Hunger Vital Sign    Worried About Running Out of Food in the Last Year: Never true    Ran Out of Food in the Last Year: Never true  Transportation Needs: No Transportation Needs (05/02/2024)   PRAPARE - Administrator, Civil Service (Medical): No    Lack of Transportation (Non-Medical): No  Physical Activity: Sufficiently Active (05/02/2024)   Exercise Vital Sign    Days of Exercise per Week: 5 days    Minutes of Exercise per Session: 90 min  Stress: No Stress Concern Present (05/02/2024)   [employer] of Occupational Health - Occupational Stress Questionnaire    Feeling of Stress: Not at all  Social Connections: Moderately Isolated (05/02/2024)   Social Connection and Isolation Panel    Frequency of Communication with Friends and Family: More than three times a week    Frequency of Social Gatherings with Friends and Family: More than three times a week    Attends Religious Services: 1 to 4 times per year    Active Member of [employer] or Organizations: No    Attends  Banker Meetings: Not on file    Marital Status: Divorced  Intimate Partner Violence: Not on file    Outpatient Encounter Medications as of 08/17/2024  Medication Sig   lisinopril -hydrochlorothiazide  (ZESTORETIC ) 10-12.5 MG tablet Take 1 tablet by mouth daily.   predniSONE (DELTASONE) 20 MG tablet Take 2 tablets (40 mg total) by mouth daily with breakfast for 5 days.   No facility-administered encounter medications on file as of 08/17/2024.    No Known Allergies  Pertinent ROS per HPI, otherwise unremarkable      Objective:  BP 134/80   Pulse (!) 58   Temp (!) 97.2 F (36.2 C)   Ht 5' 10 (1.778 m)   Wt 181 lb 12.8 oz (82.5 kg)   SpO2 99%   BMI 26.09 kg/m    Wt Readings from Last 3 Encounters:  08/17/24 181 lb 12.8 oz (82.5 kg)  05/06/24 185 lb 9.6 oz (84.2 kg)  05/06/23 194 lb 2 oz (88.1 kg)    Physical Exam Vitals and nursing note reviewed.  Constitutional:      General: He is not in acute distress.    Appearance: Normal appearance. He is normal weight. He is not ill-appearing, toxic-appearing or diaphoretic.  HENT:     Head: Normocephalic and atraumatic.     Nose: Nose normal.     Mouth/Throat:     Mouth: Mucous membranes are moist.     Pharynx: Oropharynx is clear.  Eyes:     Pupils: Pupils are equal, round, and reactive to light.  Cardiovascular:     Rate and Rhythm: Normal rate and regular rhythm.     Heart sounds: Normal heart sounds. No murmur heard.    No friction rub. No gallop.  Pulmonary:     Effort: Pulmonary effort is normal.     Breath sounds: Normal breath sounds.  Musculoskeletal:     Right hand: Swelling (thumb) and tenderness present. Decreased range of motion.     Cervical back: Neck supple.     Right upper leg: Normal.     Right knee: Swelling present. No deformity. Decreased range of motion. Tenderness present over the lateral joint line, LCL and PCL.     Right lower leg: Normal.     Comments: Left BKA, prosthesis in  place.   Skin:    General: Skin is warm and dry.     Capillary Refill: Capillary refill takes less than 2 seconds.  Neurological:     General: No focal deficit present.     Mental Status: He is alert and oriented to person, place, and time.  Psychiatric:        Mood and Affect: Mood normal.        Behavior: Behavior  normal.        Thought Content: Thought content normal.        Judgment: Judgment normal.     X-Ray: right knee: 2 concerning areas that resemble bone lesions. Rad Tech aware to bring this to attention of the radiologist reading the films. Preliminary x-ray reading by Barry Bruns, FNP-C, [facility].   Results for orders placed or performed in visit on 05/31/24  CMP14+EGFR   Collection Time: 05/31/24 11:11 AM  Result Value Ref Range   Glucose 81 70 - 99 mg/dL   BUN 15 6 - 24 mg/dL   Creatinine, Ser 8.58 (H) 0.76 - 1.27 mg/dL   eGFR 60 >40 fO/fpw/8.26   BUN/Creatinine Ratio 11 9 - 20   Sodium 139 134 - 144 mmol/L   Potassium 4.5 3.5 - 5.2 mmol/L   Chloride 103 96 - 106 mmol/L   CO2 23 20 - 29 mmol/L   Calcium 9.2 8.7 - 10.2 mg/dL   Total Protein 6.1 6.0 - 8.5 g/dL   Albumin 4.1 3.8 - 4.9 g/dL   Globulin, Total 2.0 1.5 - 4.5 g/dL   Bilirubin Total 0.2 0.0 - 1.2 mg/dL   Alkaline Phosphatase 60 44 - 121 IU/L   AST 40 0 - 40 IU/L   ALT 32 0 - 44 IU/L       Pertinent labs & imaging results that were available during my care of the patient were reviewed by me and considered in my medical decision making.  Assessment & Plan:  Ilan was seen today for knee pain.  Diagnoses and all orders for this visit:  Chronic pain of right knee -     predniSONE (DELTASONE) 20 MG tablet; Take 2 tablets (40 mg total) by mouth daily with breakfast for 5 days. -     DG Knee 1-2 Views Right -     Ambulatory referral to Physical Therapy  Primary hypertension -     BMP8+EGFR      Right knee pain with swelling and functional limitation Chronic right knee pain with swelling and  functional limitation. Recent cessation of Celebrex due to decline in kidney function. Pain localized to the sides and front of the knee, with a sensation of instability, especially when using stairs. Differential includes lateral collateral ligament (LCL) involvement or other structural issues. Plan to avoid NSAIDs due to kidney concerns. Discussed potential future knee surgery, but will attempt to prolong it with current treatment plan. He is open to physical therapy and topical treatments. - Initiate a burst of prednisone for anti-inflammatory effect - Order x-ray of the right knee - Refer to physical therapy for knee rehabilitation - Advise use of topical Biofreeze for symptomatic relief - If no improvement in 2-3 weeks, consider MRI and refer back to Dr. Baird for further evaluation  Declined renal function  Recent decline in function, necessitating cessation of Celebrex. - Order blood draw to assess current kidney function          Continue all other maintenance medications.  Follow up plan: Return if symptoms worsen or fail to improve.   Continue healthy lifestyle choices, including diet (rich in fruits, vegetables, and lean proteins, and low in salt and simple carbohydrates) and exercise (at least 30 minutes of moderate physical activity daily).  Educational handout given for knee pain   The above assessment and management plan was discussed with the patient. The patient verbalized understanding of and has agreed to the management plan. Patient is aware to call the  clinic if they develop any new symptoms or if symptoms persist or worsen. Patient is aware when to return to the clinic for a follow-up visit. Patient educated on when it is appropriate to go to the emergency department.   Barry Bruns, FNP-C [facility] [phone]

## 2024-08-18 LAB — BMP8+EGFR
BUN/Creatinine Ratio: 14 (ref 9–20)
BUN: 18 mg/dL (ref 6–24)
CO2: 23 mmol/L (ref 20–29)
Calcium: 9.6 mg/dL (ref 8.7–10.2)
Chloride: 102 mmol/L (ref 96–106)
Creatinine, Ser: 1.3 mg/dL — ABNORMAL HIGH (ref 0.76–1.27)
Glucose: 93 mg/dL (ref 70–99)
Potassium: 4.7 mmol/L (ref 3.5–5.2)
Sodium: 140 mmol/L (ref 134–144)
eGFR: 65 mL/min/1.73 (ref >59)

## 2024-08-23 ENCOUNTER — Telehealth: Payer: Self-pay | Admitting: Family Medicine

## 2024-08-23 ENCOUNTER — Other Ambulatory Visit: Payer: Self-pay

## 2024-08-23 ENCOUNTER — Ambulatory Visit: Payer: Self-pay | Attending: Family Medicine | Admitting: Physical Therapy

## 2024-08-23 ENCOUNTER — Encounter: Payer: Self-pay | Admitting: Physical Therapy

## 2024-08-23 DIAGNOSIS — M25561 Pain in right knee: Secondary | ICD-10-CM | POA: Insufficient documentation

## 2024-08-23 DIAGNOSIS — G8929 Other chronic pain: Secondary | ICD-10-CM | POA: Diagnosis present

## 2024-08-23 NOTE — Therapy (Signed)
 OUTPATIENT PHYSICAL THERAPY LOWER EXTREMITY EVALUATION   Patient Name: Barry Conway MRN: [id number] DOB:09/27/1971, 53 y.o., male Today's Date: 08/23/2024  END OF SESSION:  PT End of Session - 08/23/24 0933     Visit Number 1    Number of Visits 4    Date for Recertification  09/20/24    PT Start Time 0848    PT Stop Time 0915    PT Time Calculation (min) 27 min    Activity Tolerance Patient tolerated treatment well    Behavior During Therapy [facility] for tasks assessed/performed          History reviewed. No pertinent past medical history. Past Surgical History:  Procedure Laterality Date   BACK SURGERY  2001   HERNIA REPAIR     LEG SURGERY  2001   Patient Active Problem List   Diagnosis Date Noted   Hypertension 09/27/2021   BMI 26.0-26.9,adult 09/27/2021   History of left below knee amputation (HCC) 12/15/2013   REFERRING PROVIDER: Rock Bruns  REFERRING DIAG: Chronic pain of right knee.  THERAPY DIAG:  Chronic pain of right knee  Rationale for Evaluation and Treatment: Rehabilitation  ONSET DATE: Ongoing.    SUBJECTIVE:   SUBJECTIVE STATEMENT: The patient presents to the clinic with c/o chronic right knee pain.  He states he had to come off Celebrex due to kidneys issues and his right knee swelled and hurt a lot.  A regimen of Prednisone has been helpful.  He workouts out frequently at a local gym with focus on upper body.  We discussed establishing a lower extremity workout that he can replicate at the gym.  He states stairs increase his knee pain and therefore he tends to perform them in a non-reciprocating fashion.  He describes the pain as a throbbing and shooting.  The medial side of his right knee is numb due to a lumbar surgery in 2001.    PERTINENT HISTORY: Lumbar surgery. Left BKA with prosthesis.     PAIN:  Are you having pain? Yes: NPRS scale: 5-6/10.   Pain location: Right knee.   Pain description: As above.   Aggravating factors: Stairs.    Relieving factors: Rest.    PRECAUTIONS: Other: No pain increase therex.  RED FLAGS: None   WEIGHT BEARING RESTRICTIONS: No  FALLS:  Has patient fallen in last 6 months? No  LIVING ENVIRONMENT: Lives in: House/apartment Has following equipment at home: None  OCCUPATION: Grave digger.    PLOF: Independent  PATIENT GOALS: Not have knee pain.     OBJECTIVE:  Note: Objective measures were completed at Evaluation unless otherwise noted.  DIAGNOSTIC FINDINGS: 08/17/24: X-ray:  1. Possible small knee joint effusion. 2. Minimal spurring of the patellofemoral compartment.  MRI:  05/21/2020:  1. Moderate patellofemoral degenerative changes with prominent subchondral cyst and surrounding reactive edema in the lateral femoral trochlea. 2. Intact menisci, cruciate and collateral ligaments. 3. No definite acute osseous findings.  PATIENT SURVEYS:  LEFS:  54/80.   POSTURE: Right knee genu varum.    PALPATION: Pain reported under right kneecap.    LOWER EXTREMITY ROM:  Full active right knee range of motion.    LOWER EXTREMITY MMT:  No strength deficits noted.    LOWER EXTREMITY SPECIAL TESTS:  Knee is generally stable.    GAIT: Right knee genu varum noted.  TREATMENT DATE:     PATIENT EDUCATION:  Education details: We discussed exercise prescription  Person educated: Patient Education method: Explanation and Demonstration Education comprehension: verbalized understanding and returned demonstration  HOME EXERCISE PROGRAM:   ASSESSMENT:  CLINICAL IMPRESSION: The patient presents to [facility] with c/o chronic right knee pain. He reports pain under his kneecap.  His range of motion is normal.  His knee is generally stable though it is remarkable for genu varum.  He workouts out regularly and we pain to develop a LE pain that he can replicate  at the gym.  His LEFS score is 54/80.     OBJECTIVE IMPAIRMENTS: decreased activity tolerance and pain.   ACTIVITY LIMITATIONS: carrying, lifting, bending, standing, and squatting, stairs.  PARTICIPATION LIMITATIONS: occupation and yard work  PERSONAL FACTORS: Time since onset of injury/illness/exacerbation and 1-2 comorbidities: prior lumbar surgery left LE amputation are also affecting patient's functional outcome.   REHAB POTENTIAL: Good  CLINICAL DECISION MAKING: Evolving/moderate complexity  EVALUATION COMPLEXITY: Low   GOALS:  SHORT TERM GOALS: Target date: 09/20/24  Ind with a HEP. Goal status: INITIAL   PLAN:  PT FREQUENCY/DURATION: 4 visits.    PLANNED INTERVENTIONS: 97110-Therapeutic exercises, 97530- Therapeutic activity, [id number]- Neuromuscular re-education, 97535- Self Care, 02859- Manual therapy, and Patient/Family education  PLAN FOR NEXT SESSION: Recumbent bike, no pain increase O and CKC exercise.     Adriano Bischof, PT 08/23/2024, 10:29 AM

## 2024-08-23 NOTE — Telephone Encounter (Signed)
 Copied from CRM [id number]. Topic: General - Billing Inquiry >> Aug 23, 2024 10:19 AM Diannia H wrote: Reason for CRM: Patient was calling to inform the clinic to call [employer] because he has not had a lapse in his insurance at all and it is still active with the same member id number. [facility] number is [phone]

## 2024-08-23 NOTE — Telephone Encounter (Signed)
 Spoke to patient about this issue, will discuss with Barry Conway and call him back.

## 2024-08-30 ENCOUNTER — Encounter: Payer: Self-pay | Admitting: Physical Therapy

## 2024-08-30 ENCOUNTER — Ambulatory Visit: Admitting: Physical Therapy

## 2024-08-30 DIAGNOSIS — G8929 Other chronic pain: Secondary | ICD-10-CM

## 2024-08-30 DIAGNOSIS — M25561 Pain in right knee: Secondary | ICD-10-CM | POA: Diagnosis not present

## 2024-08-30 NOTE — Therapy (Signed)
 OUTPATIENT PHYSICAL THERAPY LOWER EXTREMITY TREATMENT  Patient Name: Barry Conway MRN: [id number] DOB:1971/03/20, 53 y.o., male Today's Date: 08/30/2024  END OF SESSION:  PT End of Session - 08/30/24 0841     Visit Number 2    Number of Visits 4    Date for Recertification  09/20/24    PT Start Time 0800    PT Stop Time 0837    PT Time Calculation (min) 37 min    Activity Tolerance Patient tolerated treatment well    Behavior During Therapy [facility] for tasks assessed/performed          History reviewed. No pertinent past medical history. Past Surgical History:  Procedure Laterality Date   BACK SURGERY  2001   HERNIA REPAIR     LEG SURGERY  2001   Patient Active Problem List   Diagnosis Date Noted   Hypertension 09/27/2021   BMI 26.0-26.9,adult 09/27/2021   History of left below knee amputation (HCC) 12/15/2013   REFERRING PROVIDER: Rock Bruns  REFERRING DIAG: Chronic pain of right knee.  THERAPY DIAG:  Chronic pain of right knee  Rationale for Evaluation and Treatment: Rehabilitation  ONSET DATE: Ongoing.    SUBJECTIVE:   SUBJECTIVE STATEMENT: No new complaints.  PERTINENT HISTORY: Lumbar surgery. Left BKA with prosthesis.     PAIN:  Are you having pain? Yes: NPRS scale: 5-6/10.   Pain location: Right knee.   Pain description: As above.   Aggravating factors: Stairs.   Relieving factors: Rest.    PRECAUTIONS: Other: No pain increase therex.  RED FLAGS: None   WEIGHT BEARING RESTRICTIONS: No  FALLS:  Has patient fallen in last 6 months? No  LIVING ENVIRONMENT: Lives in: House/apartment Has following equipment at home: None  OCCUPATION: Grave digger.    PLOF: Independent  PATIENT GOALS: Not have knee pain.     OBJECTIVE:  Note: Objective measures were completed at Evaluation unless otherwise noted.  DIAGNOSTIC FINDINGS: 08/17/24: X-ray:  1. Possible small knee joint effusion. 2. Minimal spurring of the patellofemoral  compartment.  MRI:  05/21/2020:  1. Moderate patellofemoral degenerative changes with prominent subchondral cyst and surrounding reactive edema in the lateral femoral trochlea. 2. Intact menisci, cruciate and collateral ligaments. 3. No definite acute osseous findings.  PATIENT SURVEYS:  LEFS:  54/80.   POSTURE: Right knee genu varum.    PALPATION: Pain reported under right kneecap.    LOWER EXTREMITY ROM:  Full active right knee range of motion.    LOWER EXTREMITY MMT:  No strength deficits noted.    LOWER EXTREMITY SPECIAL TESTS:  Knee is generally stable.    GAIT: Right knee genu varum noted.                                                                                                                                 TREATMENT DATE:    08/30/24:  EXERCISE LOG  Exercise Repetitions and Resistance Comments  Recumbent bike Level 3 x 17 minutes   Knee ext 10# x 3 minutes   Ham curls 40# x 3 minutes   Leg Press 3 plates x 4 minutes       In supine:  1-1 sustained right hamstring stretch x 5 minutes.    PATIENT EDUCATION:  Education details: We discussed exercise prescription  Person educated: Patient Education method: Explanation and Demonstration Education comprehension: verbalized understanding and returned demonstration  HOME EXERCISE PROGRAM:   ASSESSMENT:  CLINICAL IMPRESSION: Patient highly motivated and performed all activities without pain increase.  He plans to replicate these exercises at the gym he attends and will call clinic over the next couple of weeks should he feels he needs another visit.    OBJECTIVE IMPAIRMENTS: decreased activity tolerance and pain.   ACTIVITY LIMITATIONS: carrying, lifting, bending, standing, and squatting, stairs.  PARTICIPATION LIMITATIONS: occupation and yard work  PERSONAL FACTORS: Time since onset of injury/illness/exacerbation and 1-2 comorbidities: prior lumbar surgery left  LE amputation are also affecting patient's functional outcome.   REHAB POTENTIAL: Good  CLINICAL DECISION MAKING: Evolving/moderate complexity  EVALUATION COMPLEXITY: Low   GOALS:  SHORT TERM GOALS: Target date: 09/20/24  Ind with a HEP. Goal status: INITIAL   PLAN:  PT FREQUENCY/DURATION: 4 visits.    PLANNED INTERVENTIONS: 97110-Therapeutic exercises, 97530- Therapeutic activity, [id number]- Neuromuscular re-education, 97535- Self Care, 02859- Manual therapy, and Patient/Family education  PLAN FOR NEXT SESSION: Recumbent bike, no pain increase O and CKC exercise.     Lamica Mccart, PT 08/30/2024, 8:46 AM

## 2024-11-20 ENCOUNTER — Other Ambulatory Visit: Payer: Self-pay | Admitting: Family Medicine

## 2024-11-20 DIAGNOSIS — I10 Essential (primary) hypertension: Secondary | ICD-10-CM

## 2025-05-10 ENCOUNTER — Encounter: Payer: Self-pay | Admitting: Family Medicine
